# Patient Record
Sex: Female | Born: 1940 | Race: White | Hispanic: No | Marital: Married | Smoking: Former smoker
Health system: Southern US, Community
[De-identification: ages and names within clinical notes are randomized; demographics above are authoritative.]

## PROBLEM LIST (undated history)

## (undated) DIAGNOSIS — I10 Essential (primary) hypertension: Secondary | ICD-10-CM

## (undated) DIAGNOSIS — M1990 Unspecified osteoarthritis, unspecified site: Secondary | ICD-10-CM

## (undated) HISTORY — PX: COLONOSCOPY W/ POLYPECTOMY: SHX1380

## (undated) HISTORY — PX: OTHER SURGICAL HISTORY: SHX169

---

## 1998-04-20 ENCOUNTER — Other Ambulatory Visit: Admission: RE | Admit: 1998-04-20 | Discharge: 1998-04-20 | Payer: Self-pay | Admitting: Internal Medicine

## 1999-04-24 ENCOUNTER — Ambulatory Visit (HOSPITAL_COMMUNITY): Admission: RE | Admit: 1999-04-24 | Discharge: 1999-04-24 | Payer: Self-pay | Admitting: Internal Medicine

## 1999-04-24 ENCOUNTER — Encounter (HOSPITAL_BASED_OUTPATIENT_CLINIC_OR_DEPARTMENT_OTHER): Payer: Self-pay | Admitting: Internal Medicine

## 2000-06-10 ENCOUNTER — Other Ambulatory Visit: Admission: RE | Admit: 2000-06-10 | Discharge: 2000-06-10 | Payer: Self-pay | Admitting: Internal Medicine

## 2004-11-02 ENCOUNTER — Ambulatory Visit (HOSPITAL_COMMUNITY): Admission: RE | Admit: 2004-11-02 | Discharge: 2004-11-02 | Payer: Self-pay | Admitting: Gastroenterology

## 2013-12-22 ENCOUNTER — Inpatient Hospital Stay: Admit: 2013-12-22 | Payer: Self-pay | Admitting: Orthopedic Surgery

## 2013-12-22 SURGERY — ARTHROPLASTY, KNEE, TOTAL
Anesthesia: General | Laterality: Right

## 2015-01-12 DIAGNOSIS — E039 Hypothyroidism, unspecified: Secondary | ICD-10-CM | POA: Diagnosis not present

## 2015-01-12 DIAGNOSIS — R8299 Other abnormal findings in urine: Secondary | ICD-10-CM | POA: Diagnosis not present

## 2015-01-12 DIAGNOSIS — N390 Urinary tract infection, site not specified: Secondary | ICD-10-CM | POA: Diagnosis not present

## 2015-01-12 DIAGNOSIS — E559 Vitamin D deficiency, unspecified: Secondary | ICD-10-CM | POA: Diagnosis not present

## 2015-01-12 DIAGNOSIS — E785 Hyperlipidemia, unspecified: Secondary | ICD-10-CM | POA: Diagnosis not present

## 2015-01-12 DIAGNOSIS — N183 Chronic kidney disease, stage 3 (moderate): Secondary | ICD-10-CM | POA: Diagnosis not present

## 2015-01-12 DIAGNOSIS — E1129 Type 2 diabetes mellitus with other diabetic kidney complication: Secondary | ICD-10-CM | POA: Diagnosis not present

## 2015-01-19 DIAGNOSIS — M5432 Sciatica, left side: Secondary | ICD-10-CM | POA: Diagnosis not present

## 2015-01-19 DIAGNOSIS — Z0000 Encounter for general adult medical examination without abnormal findings: Secondary | ICD-10-CM | POA: Diagnosis not present

## 2015-01-19 DIAGNOSIS — K219 Gastro-esophageal reflux disease without esophagitis: Secondary | ICD-10-CM | POA: Diagnosis not present

## 2015-01-19 DIAGNOSIS — N183 Chronic kidney disease, stage 3 (moderate): Secondary | ICD-10-CM | POA: Diagnosis not present

## 2015-01-19 DIAGNOSIS — K5730 Diverticulosis of large intestine without perforation or abscess without bleeding: Secondary | ICD-10-CM | POA: Diagnosis not present

## 2015-01-19 DIAGNOSIS — D649 Anemia, unspecified: Secondary | ICD-10-CM | POA: Diagnosis not present

## 2015-01-19 DIAGNOSIS — E1129 Type 2 diabetes mellitus with other diabetic kidney complication: Secondary | ICD-10-CM | POA: Diagnosis not present

## 2015-01-19 DIAGNOSIS — I440 Atrioventricular block, first degree: Secondary | ICD-10-CM | POA: Diagnosis not present

## 2015-01-20 DIAGNOSIS — Z1212 Encounter for screening for malignant neoplasm of rectum: Secondary | ICD-10-CM | POA: Diagnosis not present

## 2015-02-15 ENCOUNTER — Other Ambulatory Visit: Payer: Self-pay | Admitting: Gastroenterology

## 2015-02-16 ENCOUNTER — Encounter (HOSPITAL_COMMUNITY): Payer: Self-pay | Admitting: *Deleted

## 2015-02-18 DIAGNOSIS — L570 Actinic keratosis: Secondary | ICD-10-CM | POA: Diagnosis not present

## 2015-02-18 DIAGNOSIS — Z85828 Personal history of other malignant neoplasm of skin: Secondary | ICD-10-CM | POA: Diagnosis not present

## 2015-02-18 DIAGNOSIS — D485 Neoplasm of uncertain behavior of skin: Secondary | ICD-10-CM | POA: Diagnosis not present

## 2015-02-24 ENCOUNTER — Ambulatory Visit (HOSPITAL_COMMUNITY): Payer: Commercial Managed Care - HMO | Admitting: Anesthesiology

## 2015-02-24 ENCOUNTER — Ambulatory Visit (HOSPITAL_COMMUNITY)
Admission: RE | Admit: 2015-02-24 | Discharge: 2015-02-24 | Disposition: A | Discharge status: Home / Self Care | Payer: Commercial Managed Care - HMO | Source: Ambulatory Visit | Attending: Gastroenterology | Admitting: Gastroenterology

## 2015-02-24 ENCOUNTER — Encounter (HOSPITAL_COMMUNITY): Payer: Self-pay | Admitting: *Deleted

## 2015-02-24 ENCOUNTER — Encounter (HOSPITAL_COMMUNITY)
Admission: RE | Disposition: A | Discharge status: Home / Self Care | Payer: Self-pay | Source: Ambulatory Visit | Attending: Gastroenterology

## 2015-02-24 DIAGNOSIS — I10 Essential (primary) hypertension: Secondary | ICD-10-CM | POA: Diagnosis not present

## 2015-02-24 DIAGNOSIS — D123 Benign neoplasm of transverse colon: Secondary | ICD-10-CM | POA: Diagnosis not present

## 2015-02-24 DIAGNOSIS — Z1211 Encounter for screening for malignant neoplasm of colon: Secondary | ICD-10-CM | POA: Insufficient documentation

## 2015-02-24 DIAGNOSIS — R195 Other fecal abnormalities: Secondary | ICD-10-CM | POA: Diagnosis not present

## 2015-02-24 DIAGNOSIS — Z86010 Personal history of colonic polyps: Secondary | ICD-10-CM | POA: Insufficient documentation

## 2015-02-24 DIAGNOSIS — K922 Gastrointestinal hemorrhage, unspecified: Secondary | ICD-10-CM | POA: Diagnosis not present

## 2015-02-24 DIAGNOSIS — K5790 Diverticulosis of intestine, part unspecified, without perforation or abscess without bleeding: Secondary | ICD-10-CM | POA: Diagnosis not present

## 2015-02-24 DIAGNOSIS — Z87891 Personal history of nicotine dependence: Secondary | ICD-10-CM | POA: Diagnosis not present

## 2015-02-24 HISTORY — PX: COLONOSCOPY WITH PROPOFOL: SHX5780

## 2015-02-24 HISTORY — DX: Essential (primary) hypertension: I10

## 2015-02-24 HISTORY — PX: ESOPHAGOGASTRODUODENOSCOPY (EGD) WITH PROPOFOL: SHX5813

## 2015-02-24 HISTORY — DX: Unspecified osteoarthritis, unspecified site: M19.90

## 2015-02-24 SURGERY — ESOPHAGOGASTRODUODENOSCOPY (EGD) WITH PROPOFOL
Anesthesia: Monitor Anesthesia Care

## 2015-02-24 MED ORDER — SODIUM CHLORIDE 0.9 % IV SOLN
INTRAVENOUS | Status: DC
Start: 2015-02-24 — End: 2015-02-24

## 2015-02-24 MED ORDER — LACTATED RINGERS IV SOLN
INTRAVENOUS | Status: DC
  Administered 2015-02-24: 12:00:00 via INTRAVENOUS
  Administered 2015-02-24: 1000 mL via INTRAVENOUS

## 2015-02-24 MED ORDER — PROPOFOL 10 MG/ML IV BOLUS
INTRAVENOUS | Status: AC
  Filled 2015-02-24: qty 20

## 2015-02-24 MED ORDER — LIDOCAINE HCL (CARDIAC) 20 MG/ML IV SOLN
INTRAVENOUS | Status: AC
  Filled 2015-02-24: qty 5

## 2015-02-24 MED ORDER — PROPOFOL INFUSION 10 MG/ML OPTIME
INTRAVENOUS | Status: DC | PRN
  Administered 2015-02-24: 100 ug/kg/min via INTRAVENOUS

## 2015-02-24 MED ORDER — BUTAMBEN-TETRACAINE-BENZOCAINE 2-2-14 % EX AERO
INHALATION_SPRAY | CUTANEOUS | Status: DC | PRN
  Administered 2015-02-24: 1 via TOPICAL

## 2015-02-24 MED ORDER — PROPOFOL 500 MG/50ML IV EMUL
INTRAVENOUS | Status: DC | PRN
  Administered 2015-02-24: 30 mg via INTRAVENOUS
  Administered 2015-02-24: 50 mg via INTRAVENOUS
  Administered 2015-02-24: 30 mg via INTRAVENOUS

## 2015-02-24 SURGICAL SUPPLY — 25 items

## 2015-02-24 NOTE — Discharge Instructions (Signed)
Colonoscopy, Care After °Refer to this sheet in the next few weeks. These instructions provide you with information on caring for yourself after your procedure. Your health care provider may also give you more specific instructions. Your treatment has been planned according to current medical practices, but problems sometimes occur. Call your health care provider if you have any problems or questions after your procedure. °WHAT TO EXPECT AFTER THE PROCEDURE  °After your procedure, it is typical to have the following: °· A small amount of blood in your stool. °· Moderate amounts of gas and mild abdominal cramping or bloating. °HOME CARE INSTRUCTIONS °· Do not drive, operate machinery, or sign important documents for 24 hours. °· You may shower and resume your regular physical activities, but move at a slower pace for the first 24 hours. °· Take frequent rest periods for the first 24 hours. °· Walk around or put a warm pack on your abdomen to help reduce abdominal cramping and bloating. °· Drink enough fluids to keep your urine clear or pale yellow. °· You may resume your normal diet as instructed by your health care provider. Avoid heavy or fried foods that are hard to digest. °· Avoid drinking alcohol for 24 hours or as instructed by your health care provider. °· Only take over-the-counter or prescription medicines as directed by your health care provider. °· If a tissue sample (biopsy) was taken during your procedure: °· Do not take aspirin or blood thinners for 7 days, or as instructed by your health care provider. °· Do not drink alcohol for 7 days, or as instructed by your health care provider. °· Eat soft foods for the first 24 hours. °SEEK MEDICAL CARE IF: °You have persistent spotting of blood in your stool 2-3 days after the procedure. °SEEK IMMEDIATE MEDICAL CARE IF: °· You have more than a small spotting of blood in your stool. °· You pass large blood clots in your stool. °· Your abdomen is swollen  (distended). °· You have nausea or vomiting. °· You have a fever. °· You have increasing abdominal pain that is not relieved with medicine. °Document Released: 03/13/2004 Document Revised: 05/20/2013 Document Reviewed: 04/06/2013 °ExitCare® Patient Information ©2015 [employer]. This information is not intended to replace advice given to you by your health care provider. Make sure you discuss any questions you have with your health care provider. °Esophagogastroduodenoscopy °Care After °Refer to this sheet in the next few weeks. These instructions provide you with information on caring for yourself after your procedure. Your caregiver may also give you more specific instructions. Your treatment has been planned according to current medical practices, but problems sometimes occur. Call your caregiver if you have any problems or questions after your procedure.  °HOME CARE INSTRUCTIONS °· Do not eat or drink anything until the numbing medicine (local anesthetic) has worn off and your gag reflex has returned. You will know that the local anesthetic has worn off when you can swallow comfortably. °· Do not drive for 12 hours after the procedure or as directed by your caregiver. °· Only take medicines as directed by your caregiver. °SEEK MEDICAL CARE IF:  °· You cannot stop coughing. °· You are not urinating at all or less than usual. °SEEK IMMEDIATE MEDICAL CARE IF: °· You have difficulty swallowing. °· You cannot eat or drink. °· You have worsening throat or chest pain. °· You have dizziness, lightheadedness, or you faint. °· You have nausea or vomiting. °· You have chills. °· You have a   fever. °· You have severe abdominal pain. °· You have black, tarry, or bloody stools. °Document Released: 07/16/2012 Document Reviewed: 07/16/2012 °ExitCare® Patient Information ©2015 [employer]. This information is not intended to replace advice given to you by your health care provider. Make sure you discuss any questions you have  with your health care provider. ° ° °

## 2015-02-24 NOTE — Op Note (Signed)
Problem: Heme positive stool while taking aspirin. 12/06/2009 normal surveillance colonoscopy. 11/02/2004 colonoscopy performed with removal of a 3 mm adenomatous rectal polyp  Endoscopist: Earle Gell  Premedication: Propofol administered by anesthesia  Procedure: Diagnostic esophagogastroduodenoscopy The patient was placed in the left lateral decubitus position. The Pentax gastroscope was passed through the posterior hypopharynx into the proximal esophagus without difficulty. The hypopharynx, larynx, and vocal cords appeared normal.  Esophagoscopy: The proximal, mid, and lower segments of the esophageal mucosa appeared normal. The squamocolumnar junction was noted at approximately 35 cm from the incisor teeth.  Gastroscopy: Retroflex view of the gastric cardia and fundus was normal. The gastric body, antrum, and pylorus appeared normal.  Duodenoscopy: Duodenal bulb and descending duodenum appeared normal.  Assessment: Normal diagnostic esophagogastroduodenoscopy  Procedure: Surveillance colonoscopy Anal inspection and digital rectal exam were normal. The Pentax pediatric colonoscope was introduced into the rectum and advanced to the cecum. A normal-appearing appendiceal orifice and ileocecal valve were identified. Colonic preparation for the exam today was good. Withdrawal time was 13 minutes  Rectum. Normal. Retroflexed view of the distal rectum was normal  Sigmoid colon and descending colon. Left colonic diverticulosis  Splenic flexure. Normal  Transverse colon. A 3 mm sessile polyp was removed from the mid transverse colon with the cold biopsy forceps  Hepatic flexure. Normal.  Ascending colon. Normal  Cecum and ileocecal valve. Normal  Assessment:  #1. Left colonic diverticulosis  #2. A diminutive polyp was removed from the mid transverse colon with cold biopsy forceps

## 2015-02-24 NOTE — Anesthesia Postprocedure Evaluation (Signed)
  Anesthesia Post-op Note  Patient: Gabrielle Glover  Procedure(s) Performed: Procedure(s) (LRB): ESOPHAGOGASTRODUODENOSCOPY (EGD) WITH PROPOFOL (N/A) COLONOSCOPY WITH PROPOFOL (N/A)  Patient Location: PACU  Anesthesia Type: MAC  Level of Consciousness: awake and alert   Airway and Oxygen Therapy: Patient Spontanous Breathing  Post-op Pain: mild  Post-op Assessment: Post-op Vital signs reviewed, Patient's Cardiovascular Status Stable, Respiratory Function Stable, Patent Airway and No signs of Nausea or vomiting  Last Vitals:  Filed Vitals:   02/24/15 1358  BP: 156/81  Pulse: 84  Temp:   Resp: 17    Post-op Vital Signs: stable   Complications: No apparent anesthesia complications

## 2015-02-24 NOTE — H&P (Signed)
  Problem: Heme positive stool on aspirin. 12/06/2009 normal surveillance colonoscopy. 11/02/2004 colonoscopy performed with removal of a diminutive adenomatous rectal polyp  History: The patient is a 74 year old female born 1941-01-22. She has been feeling quite well. She submitted stool for Hemoccults testing as part of her health maintenance physical exam. Her stool was heme positive. She has passed black tarry stool on a few occasions over the past few months. She reports no abdominal pain. She has stopped taking aspirin.  The patient is scheduled to undergo surveillance colonoscopy and diagnostic esophagogastroduodenoscopy.  Medication allergies: None  Past medical history: Hypertension. Diminutive adenomatous rectal polyp removed in 2006.  Exam: Patient is alert and lying comfortably on the endoscopy stretcher. Abdomen is soft and nontender to palpation. Lungs are clear to auscultation. Cardiac exam reveals a regular rhythm.  Plan: Proceed with diagnostic esophagogastroduodenoscopy and surveillance colonoscopy.

## 2015-02-24 NOTE — Transfer of Care (Signed)
Immediate Anesthesia Transfer of Care Note  Patient: Gabrielle Glover  Procedure(s) Performed: Procedure(s): ESOPHAGOGASTRODUODENOSCOPY (EGD) WITH PROPOFOL (N/A) COLONOSCOPY WITH PROPOFOL (N/A)  Patient Location: PACU  Anesthesia Type:MAC  Level of Consciousness: awake, alert  and oriented  Airway & Oxygen Therapy: Patient Spontanous Breathing and Patient connected to nasal cannula oxygen  Post-op Assessment: Report given to RN and Post -op Vital signs reviewed and stable  Post vital signs: Reviewed and stable  Last Vitals:  Filed Vitals:   02/24/15 1140  BP: 194/70  Temp: 36.7 C  Resp: 23    Complications: No apparent anesthesia complications

## 2015-02-24 NOTE — Anesthesia Preprocedure Evaluation (Addendum)
Anesthesia Evaluation  Patient identified by MRN, date of birth, ID band Patient awake    Reviewed: Allergy & Precautions, NPO status , Patient's Chart, lab work & pertinent test results  Airway Mallampati: II  TM Distance: >3 FB Neck ROM: Full    Dental no notable dental hx.    Pulmonary neg pulmonary ROS, former smoker,  breath sounds clear to auscultation  Pulmonary exam normal       Cardiovascular hypertension, Pt. on medications Normal cardiovascular examRhythm:Regular Rate:Normal     Neuro/Psych negative neurological ROS  negative psych ROS   GI/Hepatic negative GI ROS, Neg liver ROS,   Endo/Other  negative endocrine ROS  Renal/GU negative Renal ROS  negative genitourinary   Musculoskeletal negative musculoskeletal ROS (+)   Abdominal   Peds negative pediatric ROS (+)  Hematology negative hematology ROS (+)   Anesthesia Other Findings   Reproductive/Obstetrics negative OB ROS                           Anesthesia Physical Anesthesia Plan  ASA: II  Anesthesia Plan: MAC   Post-op Pain Management:    Induction: Intravenous  Airway Management Planned: Nasal Cannula  Additional Equipment:   Intra-op Plan:   Post-operative Plan:   Informed Consent: I have reviewed the patients History and Physical, chart, labs and discussed the procedure including the risks, benefits and alternatives for the proposed anesthesia with the patient or authorized representative who has indicated his/her understanding and acceptance.   Dental advisory given  Plan Discussed with: CRNA and Surgeon  Anesthesia Plan Comments:         Anesthesia Quick Evaluation

## 2015-02-28 ENCOUNTER — Encounter (HOSPITAL_COMMUNITY): Payer: Self-pay | Admitting: Gastroenterology

## 2015-07-18 DIAGNOSIS — Z85828 Personal history of other malignant neoplasm of skin: Secondary | ICD-10-CM | POA: Diagnosis not present

## 2015-07-18 DIAGNOSIS — D485 Neoplasm of uncertain behavior of skin: Secondary | ICD-10-CM | POA: Diagnosis not present

## 2015-07-18 DIAGNOSIS — D0471 Carcinoma in situ of skin of right lower limb, including hip: Secondary | ICD-10-CM | POA: Diagnosis not present

## 2016-01-19 DIAGNOSIS — L820 Inflamed seborrheic keratosis: Secondary | ICD-10-CM | POA: Diagnosis not present

## 2016-01-19 DIAGNOSIS — D485 Neoplasm of uncertain behavior of skin: Secondary | ICD-10-CM | POA: Diagnosis not present

## 2016-01-19 DIAGNOSIS — C44622 Squamous cell carcinoma of skin of right upper limb, including shoulder: Secondary | ICD-10-CM | POA: Diagnosis not present

## 2016-01-19 DIAGNOSIS — L570 Actinic keratosis: Secondary | ICD-10-CM | POA: Diagnosis not present

## 2016-01-19 DIAGNOSIS — D0461 Carcinoma in situ of skin of right upper limb, including shoulder: Secondary | ICD-10-CM | POA: Diagnosis not present

## 2016-01-19 DIAGNOSIS — Z85828 Personal history of other malignant neoplasm of skin: Secondary | ICD-10-CM | POA: Diagnosis not present

## 2016-01-25 DIAGNOSIS — E1129 Type 2 diabetes mellitus with other diabetic kidney complication: Secondary | ICD-10-CM | POA: Diagnosis not present

## 2016-01-25 DIAGNOSIS — Z0100 Encounter for examination of eyes and vision without abnormal findings: Secondary | ICD-10-CM | POA: Diagnosis not present

## 2016-01-25 DIAGNOSIS — E559 Vitamin D deficiency, unspecified: Secondary | ICD-10-CM | POA: Diagnosis not present

## 2016-01-25 DIAGNOSIS — H04123 Dry eye syndrome of bilateral lacrimal glands: Secondary | ICD-10-CM | POA: Diagnosis not present

## 2016-01-25 DIAGNOSIS — H43813 Vitreous degeneration, bilateral: Secondary | ICD-10-CM | POA: Diagnosis not present

## 2016-01-25 DIAGNOSIS — I10 Essential (primary) hypertension: Secondary | ICD-10-CM | POA: Diagnosis not present

## 2016-01-25 DIAGNOSIS — E784 Other hyperlipidemia: Secondary | ICD-10-CM | POA: Diagnosis not present

## 2016-01-25 DIAGNOSIS — H26493 Other secondary cataract, bilateral: Secondary | ICD-10-CM | POA: Diagnosis not present

## 2016-02-02 DIAGNOSIS — Z0000 Encounter for general adult medical examination without abnormal findings: Secondary | ICD-10-CM | POA: Diagnosis not present

## 2016-02-02 DIAGNOSIS — E784 Other hyperlipidemia: Secondary | ICD-10-CM | POA: Diagnosis not present

## 2016-02-02 DIAGNOSIS — D6489 Other specified anemias: Secondary | ICD-10-CM | POA: Diagnosis not present

## 2016-02-02 DIAGNOSIS — E038 Other specified hypothyroidism: Secondary | ICD-10-CM | POA: Diagnosis not present

## 2016-02-02 DIAGNOSIS — N183 Chronic kidney disease, stage 3 (moderate): Secondary | ICD-10-CM | POA: Diagnosis not present

## 2016-02-02 DIAGNOSIS — G4709 Other insomnia: Secondary | ICD-10-CM | POA: Diagnosis not present

## 2016-02-02 DIAGNOSIS — E1129 Type 2 diabetes mellitus with other diabetic kidney complication: Secondary | ICD-10-CM | POA: Diagnosis not present

## 2016-02-02 DIAGNOSIS — I440 Atrioventricular block, first degree: Secondary | ICD-10-CM | POA: Diagnosis not present

## 2016-02-02 DIAGNOSIS — I10 Essential (primary) hypertension: Secondary | ICD-10-CM | POA: Diagnosis not present

## 2016-04-28 DIAGNOSIS — Z23 Encounter for immunization: Secondary | ICD-10-CM | POA: Diagnosis not present

## 2016-06-27 DIAGNOSIS — Z1231 Encounter for screening mammogram for malignant neoplasm of breast: Secondary | ICD-10-CM | POA: Diagnosis not present

## 2016-07-26 DIAGNOSIS — I10 Essential (primary) hypertension: Secondary | ICD-10-CM | POA: Diagnosis not present

## 2016-07-26 DIAGNOSIS — M109 Gout, unspecified: Secondary | ICD-10-CM | POA: Diagnosis not present

## 2016-07-26 DIAGNOSIS — Z6827 Body mass index (BMI) 27.0-27.9, adult: Secondary | ICD-10-CM | POA: Diagnosis not present

## 2016-07-26 DIAGNOSIS — J3089 Other allergic rhinitis: Secondary | ICD-10-CM | POA: Diagnosis not present

## 2016-07-26 DIAGNOSIS — E1129 Type 2 diabetes mellitus with other diabetic kidney complication: Secondary | ICD-10-CM | POA: Diagnosis not present

## 2016-07-26 DIAGNOSIS — N183 Chronic kidney disease, stage 3 (moderate): Secondary | ICD-10-CM | POA: Diagnosis not present

## 2016-08-23 DIAGNOSIS — N390 Urinary tract infection, site not specified: Secondary | ICD-10-CM | POA: Diagnosis not present

## 2016-08-23 DIAGNOSIS — R8299 Other abnormal findings in urine: Secondary | ICD-10-CM | POA: Diagnosis not present

## 2016-08-31 DIAGNOSIS — R300 Dysuria: Secondary | ICD-10-CM | POA: Diagnosis not present

## 2016-08-31 DIAGNOSIS — N390 Urinary tract infection, site not specified: Secondary | ICD-10-CM | POA: Diagnosis not present

## 2016-08-31 DIAGNOSIS — M109 Gout, unspecified: Secondary | ICD-10-CM | POA: Diagnosis not present

## 2016-12-11 DIAGNOSIS — E1129 Type 2 diabetes mellitus with other diabetic kidney complication: Secondary | ICD-10-CM | POA: Diagnosis not present

## 2017-01-25 DIAGNOSIS — H43813 Vitreous degeneration, bilateral: Secondary | ICD-10-CM | POA: Diagnosis not present

## 2017-01-25 DIAGNOSIS — E119 Type 2 diabetes mellitus without complications: Secondary | ICD-10-CM | POA: Diagnosis not present

## 2017-01-25 DIAGNOSIS — Z961 Presence of intraocular lens: Secondary | ICD-10-CM | POA: Diagnosis not present

## 2017-01-25 DIAGNOSIS — H04123 Dry eye syndrome of bilateral lacrimal glands: Secondary | ICD-10-CM | POA: Diagnosis not present

## 2017-03-11 DIAGNOSIS — E559 Vitamin D deficiency, unspecified: Secondary | ICD-10-CM | POA: Diagnosis not present

## 2017-03-11 DIAGNOSIS — I10 Essential (primary) hypertension: Secondary | ICD-10-CM | POA: Diagnosis not present

## 2017-03-11 DIAGNOSIS — E038 Other specified hypothyroidism: Secondary | ICD-10-CM | POA: Diagnosis not present

## 2017-03-11 DIAGNOSIS — E1129 Type 2 diabetes mellitus with other diabetic kidney complication: Secondary | ICD-10-CM | POA: Diagnosis not present

## 2017-03-11 DIAGNOSIS — E784 Other hyperlipidemia: Secondary | ICD-10-CM | POA: Diagnosis not present

## 2017-03-11 DIAGNOSIS — M109 Gout, unspecified: Secondary | ICD-10-CM | POA: Diagnosis not present

## 2017-03-20 DIAGNOSIS — N183 Chronic kidney disease, stage 3 (moderate): Secondary | ICD-10-CM | POA: Diagnosis not present

## 2017-03-20 DIAGNOSIS — I440 Atrioventricular block, first degree: Secondary | ICD-10-CM | POA: Diagnosis not present

## 2017-03-20 DIAGNOSIS — Z0000 Encounter for general adult medical examination without abnormal findings: Secondary | ICD-10-CM | POA: Diagnosis not present

## 2017-03-20 DIAGNOSIS — N3281 Overactive bladder: Secondary | ICD-10-CM | POA: Diagnosis not present

## 2017-03-20 DIAGNOSIS — M25539 Pain in unspecified wrist: Secondary | ICD-10-CM | POA: Diagnosis not present

## 2017-03-20 DIAGNOSIS — M109 Gout, unspecified: Secondary | ICD-10-CM | POA: Diagnosis not present

## 2017-03-20 DIAGNOSIS — D6489 Other specified anemias: Secondary | ICD-10-CM | POA: Diagnosis not present

## 2017-03-20 DIAGNOSIS — E1129 Type 2 diabetes mellitus with other diabetic kidney complication: Secondary | ICD-10-CM | POA: Diagnosis not present

## 2017-03-20 DIAGNOSIS — E871 Hypo-osmolality and hyponatremia: Secondary | ICD-10-CM | POA: Diagnosis not present

## 2017-05-08 DIAGNOSIS — E871 Hypo-osmolality and hyponatremia: Secondary | ICD-10-CM | POA: Diagnosis not present

## 2017-05-08 DIAGNOSIS — M109 Gout, unspecified: Secondary | ICD-10-CM | POA: Diagnosis not present

## 2017-05-11 DIAGNOSIS — Z23 Encounter for immunization: Secondary | ICD-10-CM | POA: Diagnosis not present

## 2017-09-16 DIAGNOSIS — E1129 Type 2 diabetes mellitus with other diabetic kidney complication: Secondary | ICD-10-CM | POA: Diagnosis not present

## 2017-09-16 DIAGNOSIS — I10 Essential (primary) hypertension: Secondary | ICD-10-CM | POA: Diagnosis not present

## 2017-09-16 DIAGNOSIS — Z6827 Body mass index (BMI) 27.0-27.9, adult: Secondary | ICD-10-CM | POA: Diagnosis not present

## 2017-09-16 DIAGNOSIS — E038 Other specified hypothyroidism: Secondary | ICD-10-CM | POA: Diagnosis not present

## 2017-09-16 DIAGNOSIS — Z1389 Encounter for screening for other disorder: Secondary | ICD-10-CM | POA: Diagnosis not present

## 2017-09-16 DIAGNOSIS — M109 Gout, unspecified: Secondary | ICD-10-CM | POA: Diagnosis not present

## 2018-01-15 DIAGNOSIS — M7751 Other enthesopathy of right foot: Secondary | ICD-10-CM | POA: Diagnosis not present

## 2018-01-15 DIAGNOSIS — Z6828 Body mass index (BMI) 28.0-28.9, adult: Secondary | ICD-10-CM | POA: Diagnosis not present

## 2018-02-07 DIAGNOSIS — H43812 Vitreous degeneration, left eye: Secondary | ICD-10-CM | POA: Diagnosis not present

## 2018-02-07 DIAGNOSIS — E119 Type 2 diabetes mellitus without complications: Secondary | ICD-10-CM | POA: Diagnosis not present

## 2018-02-07 DIAGNOSIS — H31002 Unspecified chorioretinal scars, left eye: Secondary | ICD-10-CM | POA: Diagnosis not present

## 2018-02-07 DIAGNOSIS — H524 Presbyopia: Secondary | ICD-10-CM | POA: Diagnosis not present

## 2018-03-19 DIAGNOSIS — T148XXS Other injury of unspecified body region, sequela: Secondary | ICD-10-CM | POA: Diagnosis not present

## 2018-03-19 DIAGNOSIS — R600 Localized edema: Secondary | ICD-10-CM | POA: Diagnosis not present

## 2018-03-19 DIAGNOSIS — Z6828 Body mass index (BMI) 28.0-28.9, adult: Secondary | ICD-10-CM | POA: Diagnosis not present

## 2018-03-19 DIAGNOSIS — S8011XA Contusion of right lower leg, initial encounter: Secondary | ICD-10-CM | POA: Diagnosis not present

## 2018-03-19 DIAGNOSIS — M7989 Other specified soft tissue disorders: Secondary | ICD-10-CM | POA: Diagnosis not present

## 2018-04-19 DIAGNOSIS — Z23 Encounter for immunization: Secondary | ICD-10-CM | POA: Diagnosis not present

## 2018-04-25 DIAGNOSIS — E1129 Type 2 diabetes mellitus with other diabetic kidney complication: Secondary | ICD-10-CM | POA: Diagnosis not present

## 2018-04-25 DIAGNOSIS — E559 Vitamin D deficiency, unspecified: Secondary | ICD-10-CM | POA: Diagnosis not present

## 2018-04-25 DIAGNOSIS — N183 Chronic kidney disease, stage 3 (moderate): Secondary | ICD-10-CM | POA: Diagnosis not present

## 2018-04-25 DIAGNOSIS — E7849 Other hyperlipidemia: Secondary | ICD-10-CM | POA: Diagnosis not present

## 2018-04-25 DIAGNOSIS — E038 Other specified hypothyroidism: Secondary | ICD-10-CM | POA: Diagnosis not present

## 2018-04-25 DIAGNOSIS — M109 Gout, unspecified: Secondary | ICD-10-CM | POA: Diagnosis not present

## 2018-04-25 DIAGNOSIS — R82998 Other abnormal findings in urine: Secondary | ICD-10-CM | POA: Diagnosis not present

## 2018-05-02 DIAGNOSIS — M7750 Other enthesopathy of unspecified foot: Secondary | ICD-10-CM | POA: Diagnosis not present

## 2018-05-02 DIAGNOSIS — R600 Localized edema: Secondary | ICD-10-CM | POA: Diagnosis not present

## 2018-05-02 DIAGNOSIS — E1129 Type 2 diabetes mellitus with other diabetic kidney complication: Secondary | ICD-10-CM | POA: Diagnosis not present

## 2018-05-02 DIAGNOSIS — N183 Chronic kidney disease, stage 3 (moderate): Secondary | ICD-10-CM | POA: Diagnosis not present

## 2018-05-02 DIAGNOSIS — E7849 Other hyperlipidemia: Secondary | ICD-10-CM | POA: Diagnosis not present

## 2018-05-02 DIAGNOSIS — M109 Gout, unspecified: Secondary | ICD-10-CM | POA: Diagnosis not present

## 2018-05-02 DIAGNOSIS — Z0000 Encounter for general adult medical examination without abnormal findings: Secondary | ICD-10-CM | POA: Diagnosis not present

## 2018-05-02 DIAGNOSIS — E038 Other specified hypothyroidism: Secondary | ICD-10-CM | POA: Diagnosis not present

## 2018-05-02 DIAGNOSIS — D6489 Other specified anemias: Secondary | ICD-10-CM | POA: Diagnosis not present

## 2018-07-02 DIAGNOSIS — E038 Other specified hypothyroidism: Secondary | ICD-10-CM | POA: Diagnosis not present

## 2020-01-22 ENCOUNTER — Ambulatory Visit: Payer: Medicare Other | Admitting: Orthopaedic Surgery

## 2020-01-22 ENCOUNTER — Ambulatory Visit: Payer: Self-pay

## 2020-01-22 ENCOUNTER — Encounter: Payer: Self-pay | Admitting: Orthopaedic Surgery

## 2020-01-22 DIAGNOSIS — M25562 Pain in left knee: Secondary | ICD-10-CM

## 2020-01-22 DIAGNOSIS — G8929 Other chronic pain: Secondary | ICD-10-CM | POA: Diagnosis not present

## 2020-01-22 MED ORDER — LIDOCAINE HCL 1 % IJ SOLN
2.0000 mL | INTRAMUSCULAR | Status: AC | PRN
  Administered 2020-01-22: 2 mL

## 2020-01-22 MED ORDER — BUPIVACAINE HCL 0.5 % IJ SOLN
2.0000 mL | INTRAMUSCULAR | Status: AC | PRN
  Administered 2020-01-22: 2 mL via INTRA_ARTICULAR

## 2020-01-22 MED ORDER — METHYLPREDNISOLONE ACETATE 40 MG/ML IJ SUSP
40.0000 mg | INTRAMUSCULAR | Status: AC | PRN
  Administered 2020-01-22: 40 mg via INTRA_ARTICULAR

## 2020-01-22 NOTE — Progress Notes (Signed)
Office Visit Note   Patient: Gabrielle Glover           Date of Birth: 1941-07-14           MRN: [id number] Visit Date: 01/22/2020              Requested by: Crist Infante, MD [address] [place],   [address] PCP: Crist Infante, MD   Assessment & Plan: Visit Diagnoses:  1. Chronic pain of left knee     Plan: Impression is acute left medial meniscal tear without mechanical symptoms.  We performed an aspiration and injection in the office today.  We obtained approximately 30 cc of blood-tinged fluid.  Cortisone was injected as well.  Compression wrap applied.  Patient will take it easy over the next couple weeks and then increase activity as tolerated.  Hopefully we can avoid any surgical intervention in the future with this issue.  Follow-Up Instructions: Return if symptoms worsen or fail to improve.   Orders:  Orders Placed This Encounter  Procedures  . XR KNEE 3 VIEW LEFT   No orders of the defined types were placed in this encounter.     Procedures: Large Joint Inj: L knee on 01/22/2020 11:42 AM Details: 22 G needle Medications: 2 mL bupivacaine 0.5 %; 2 mL lidocaine 1 %; 40 mg methylPREDNISolone acetate 40 MG/ML Outcome: tolerated well, no immediate complications Patient was prepped and draped in the usual sterile fashion.       Clinical Data: No additional findings.   Subjective: Chief Complaint  Patient presents with  . Left Knee - Pain    Gabrielle Glover is a very pleasant 79 year old female comes in for evaluation of left knee pain that started 2 weeks ago when she was walking on the grass and felt pain in the back of her left knee.  It got slightly better but yesterday when she was turning she felt a pop in her knee and and it caused the knee to give way.  She is now ambulating with a cane.  She has not been able to walk her usual 2 miles a day.   Review of Systems  Constitutional: Negative.   HENT: Negative.   Eyes: Negative.   Respiratory:  Negative.   Cardiovascular: Negative.   Endocrine: Negative.   Musculoskeletal: Negative.   Neurological: Negative.   Hematological: Negative.   Psychiatric/Behavioral: Negative.   All other systems reviewed and are negative.    Objective: Vital Signs: There were no vitals taken for this visit.  Physical Exam Vitals and nursing note reviewed.  Constitutional:      Appearance: She is well-developed.  HENT:     Head: Normocephalic and atraumatic.  Pulmonary:     Effort: Pulmonary effort is normal.  Abdominal:     Palpations: Abdomen is soft.  Musculoskeletal:     Cervical back: Neck supple.  Skin:    General: Skin is warm.     Capillary Refill: Capillary refill takes less than 2 seconds.  Neurological:     Mental Status: She is alert and oriented to person, place, and time.  Psychiatric:        Behavior: Behavior normal.        Thought Content: Thought content normal.        Judgment: Judgment normal.     Ortho Exam Left knee shows a medium joint effusion.  She has medial joint line tenderness.  Negative McMurray.  Collaterals and cruciates are stable.  Mild limitation range  of motion secondary to the effusion. Specialty Comments:  No specialty comments available.  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  Past Medical History:  Diagnosis Date  . Arthritis    wrist  . Hypertension     History reviewed. No pertinent family history.  Past Surgical History:  Procedure Laterality Date  . cataracts Bilateral    with lens  . COLONOSCOPY W/ POLYPECTOMY      x2 -'06x1 polyp  . COLONOSCOPY WITH PROPOFOL N/A 02/24/2015   Procedure: COLONOSCOPY WITH PROPOFOL;  Surgeon: Garlan Fair, MD;  Location: [facility];  Service: Endoscopy;  Laterality: N/A;  . ESOPHAGOGASTRODUODENOSCOPY (EGD) WITH PROPOFOL N/A 02/24/2015   Procedure: ESOPHAGOGASTRODUODENOSCOPY (EGD) WITH  PROPOFOL;  Surgeon: Garlan Fair, MD;  Location: [facility];  Service: Endoscopy;  Laterality: N/A;   Social History   Occupational History  . Not on file  Tobacco Use  . Smoking status: Former Smoker    Years: 10.00    Quit date: 02/15/1982    Years since quitting: 37.9  . Tobacco comment: off and on smother, Quit 30 yrs ago.  Substance and Sexual Activity  . Alcohol use: Yes    Comment: wine daily   . Drug use: No  . Sexual activity: Not on file

## 2020-09-13 ENCOUNTER — Ambulatory Visit: Payer: Medicare Other | Admitting: Allergy and Immunology

## 2020-10-24 ENCOUNTER — Other Ambulatory Visit: Payer: Self-pay

## 2020-10-24 ENCOUNTER — Ambulatory Visit: Payer: Medicare Other | Admitting: Orthopaedic Surgery

## 2020-10-24 DIAGNOSIS — G5701 Lesion of sciatic nerve, right lower limb: Secondary | ICD-10-CM | POA: Diagnosis not present

## 2020-10-24 MED ORDER — PREDNISONE 5 MG (21) PO TBPK: ORAL_TABLET | ORAL | 0 refills | Status: AC

## 2020-10-24 NOTE — Progress Notes (Signed)
   Office Visit Note   Patient: Gabrielle Glover           Date of Birth: 03/03/1941           MRN: [id number] Visit Date: 10/24/2020              Requested by: Crist Infante, MD [address] [place],  [place] [address] PCP: Crist Infante, MD   Assessment & Plan: Visit Diagnoses:  1. Piriformis syndrome of right side     Plan: Impression is right-sided piriformis syndrome.  We have discussed treatment options to include prednisone pack and physical therapy for which she would like to proceed.  She will follow up with [place] as needed.  Follow-Up Instructions: Return if symptoms worsen or fail to improve.   Orders:  No orders of the defined types were placed in this encounter.  No orders of the defined types were placed in this encounter.     Procedures: No procedures performed   Clinical Data: No additional findings.   Subjective: Chief Complaint  Patient presents with  . RIGHT BUTTOCK PAIN    HPI patient is a very pleasant 80 year old female who comes in today with right buttocks pain.  This began after sitting on a hard wooden table for period of time on Christmas eve.  Her symptoms have been intermittent since and are primarily worse with ambulation.  The pain is solely located to the buttocks without any radiation down the right leg.  No paresthesias.  Massaging the area seems to slightly help.  She has tried Tylenol and Aspercreme without relief.  Review of Systems as detailed in HPI.  All others reviewed and are negative.   Objective: Vital Signs: There were no vitals taken for this visit.  Physical Exam well-developed well-nourished female no acute distress.  Alert and oriented x3.  Ortho Exam examination reveals moderate tenderness to the piriformis into the hamstring attachment.  Slight increased pain with straight leg raise.  No pain or weakness with resisted knee flexion.  She is neurovascular intact distally.  Specialty Comments:  No specialty comments  available.  Imaging: No new imaging   PMFS History: There are no problems to display for this patient.  Past Medical History:  Diagnosis Date  . Arthritis    wrist  . Hypertension     No family history on file.  Past Surgical History:  Procedure Laterality Date  . cataracts Bilateral    with lens  . COLONOSCOPY W/ POLYPECTOMY      x2 -'06x1 polyp  . COLONOSCOPY WITH PROPOFOL N/A 02/24/2015   Procedure: COLONOSCOPY WITH PROPOFOL;  Surgeon: Garlan Fair, MD;  Location: [facility];  Service: Endoscopy;  Laterality: N/A;  . ESOPHAGOGASTRODUODENOSCOPY (EGD) WITH PROPOFOL N/A 02/24/2015   Procedure: ESOPHAGOGASTRODUODENOSCOPY (EGD) WITH PROPOFOL;  Surgeon: Garlan Fair, MD;  Location: [facility];  Service: Endoscopy;  Laterality: N/A;   Social History   Occupational History  . Not on file  Tobacco Use  . Smoking status: Former Smoker    Years: 10.00    Quit date: 02/15/1982    Years since quitting: 38.7  . Smokeless tobacco: Not on file  . Tobacco comment: off and on smother, Quit 30 yrs ago.  Substance and Sexual Activity  . Alcohol use: Yes    Comment: wine daily   . Drug use: No  . Sexual activity: Not on file

## 2020-11-01 ENCOUNTER — Ambulatory Visit: Payer: Medicare Other | Admitting: Allergy and Immunology

## 2020-11-03 ENCOUNTER — Ambulatory Visit (INDEPENDENT_AMBULATORY_CARE_PROVIDER_SITE_OTHER): Payer: Medicare Other | Admitting: Rehabilitative and Restorative Service Providers"

## 2020-11-03 ENCOUNTER — Encounter: Payer: Self-pay | Admitting: Rehabilitative and Restorative Service Providers"

## 2020-11-03 ENCOUNTER — Other Ambulatory Visit: Payer: Self-pay

## 2020-11-03 DIAGNOSIS — M25652 Stiffness of left hip, not elsewhere classified: Secondary | ICD-10-CM | POA: Diagnosis not present

## 2020-11-03 DIAGNOSIS — R262 Difficulty in walking, not elsewhere classified: Secondary | ICD-10-CM

## 2020-11-03 DIAGNOSIS — M6281 Muscle weakness (generalized): Secondary | ICD-10-CM

## 2020-11-03 DIAGNOSIS — M25552 Pain in left hip: Secondary | ICD-10-CM

## 2020-11-03 NOTE — Patient Instructions (Signed)
Access Code: [email or web address] URL: [email or web address] Date: 11/03/2020 Prepared by: Vista Mink  Exercises Supine Figure 4 Piriformis Stretch - 2-3 x daily - 7 x weekly - 1 sets - 5 reps - 20 seconds hold Supine Gluteus Stretch - 2-3 x daily - 7 x weekly - 1 sets - 5 reps - 20 seconds hold Prone Hip Extension - 2 x daily - 7 x weekly - 2 sets - 10 reps - 3 seconds hold Bridge - 2 x daily - 7 x weekly - 2 sets - 10 reps - 5 seconds hold

## 2020-11-03 NOTE — Therapy (Signed)
[facility] [address] [place], [place], [address] Phone: [phone]   Fax:  [phone]  Physical Therapy Evaluation  Patient Details  Name: Gabrielle [facility] MRN: [id number] Date of Birth: 26-Aug-1940 Referring Provider (PT): Briscoe Deutscher   Encounter Date: 11/03/2020   PT End of Session - 11/03/20 1710    Visit Number 1    Number of Visits 16    Date for PT Re-Evaluation 12/29/20    PT Start Time 0800    PT Stop Time 4970    PT Time Calculation (min) 44 min    Activity Tolerance Patient tolerated treatment well;No increased pain    Behavior During Therapy WFL for tasks assessed/performed           Past Medical History:  Diagnosis Date  . Arthritis    wrist  . Hypertension     Past Surgical History:  Procedure Laterality Date  . cataracts Bilateral    with lens  . COLONOSCOPY W/ POLYPECTOMY      x2 -'06x1 polyp  . COLONOSCOPY WITH PROPOFOL N/A 02/24/2015   Procedure: COLONOSCOPY WITH PROPOFOL;  Surgeon: Garlan Fair, MD;  Location: [facility];  Service: Endoscopy;  Laterality: N/A;  . ESOPHAGOGASTRODUODENOSCOPY (EGD) WITH PROPOFOL N/A 02/24/2015   Procedure: ESOPHAGOGASTRODUODENOSCOPY (EGD) WITH PROPOFOL;  Surgeon: Garlan Fair, MD;  Location: [facility];  Service: Endoscopy;  Laterality: N/A;    There were no vitals filed for this visit.    Subjective Assessment - 11/03/20 1706    Subjective Gabrielle [facility]) reports she has L gluteal/ishial pain after sitting/leaning on a hard seat for an hour at a social engagement several months ago.  Symptoms have not improved since that time.    Pertinent History Arthritis, HTN    Limitations Sitting;House hold activities;Lifting;Standing;Walking    Patient Stated Goals Be able to return to her previous level of function (including walking for exercise) without pain.    Currently in Pain? Yes    Pain Score 4     Pain Location Buttocks    Pain Orientation Left     Pain Descriptors / Indicators Aching;Constant;Sore    Pain Type Chronic pain    Pain Radiating Towards NA    Pain Onset More than a month ago    Pain Frequency Constant    Aggravating Factors  Sitting, standing and walking    Pain Relieving Factors Supine postures    Effect of Pain on Daily Activities Unable to walk for exercises and limits WB function    Multiple Pain Sites No              [facility] PT Assessment - 11/03/20 0001      Assessment   Medical Diagnosis Piriformis Syndrome    Referring Provider (PT) Briscoe Deutscher    Onset Date/Surgical Date 08/05/20      Balance Screen   Has the patient fallen in the past 6 months No    Has the patient had a decrease in activity level because of a fear of falling?  No    Is the patient reluctant to leave their home because of a fear of falling?  No      Home Environment   Living Environment Private residence    Available Help at Discharge Family      Prior Function   Level of Independence Independent    Vocation Requirements Walking for exercises, run travel agency      Cognition   Overall Cognitive Status  Within Functional Limits for tasks assessed      Observation/Other Assessments   Focus on Therapeutic Outcomes (FOTO)  52 (Goal 72)      ROM / Strength   AROM / PROM / Strength AROM;Strength      AROM   Overall AROM  Deficits    AROM Assessment Site Hip    Right/Left Hip Left;Right    Right Hip Flexion 110    Right Hip External Rotation  28    Right Hip Internal Rotation  15    Left Hip Flexion 110    Left Hip External Rotation  28    Left Hip Internal Rotation  4      Flexibility   Soft Tissue Assessment /Muscle Length yes    Hamstrings 40 degrees L/45 degrees R                      Objective measurements completed on examination: See above findings.       [facility] Adult PT Treatment/Exercise - 11/03/20 0001      Exercises   Exercises Knee/Hip      Knee/Hip Exercises: Stretches    Piriformis Stretch Both;4 reps;20 seconds;Other (comment)   Figure 4 with overpressure   Other Knee/Hip Stretches Gluteal stretch 4X 20 seconds B      Knee/Hip Exercises: Supine   Bridges Strengthening;Both;1 set;10 reps;Other (comment)   5 seconds     Knee/Hip Exercises: Prone   Straight Leg Raises Strengthening;Both;1 set;10 reps;Other (comment)   3 seconds                 PT Education - 11/03/20 1710    Education Details Reviewed exam findings and started HEP to address impairments noted today.    Person(s) Educated Patient    Methods Explanation;Demonstration;Tactile cues;Verbal cues;Handout    Comprehension Verbal cues required;Tactile cues required;Returned demonstration;Verbalized understanding;Need further instruction            PT Short Term Goals - 11/03/20 1716      PT SHORT TERM GOAL #1   Title Gabrielle Glover will be independent with her starter HEP.    Time 2    Period Weeks    Status New    Target Date 11/17/20             PT Long Term Goals - 11/03/20 1720      PT LONG TERM GOAL #1   Title Gabrielle Glover will improve her FOTO score to 72.    Baseline 52    Time 8    Period Weeks    Status New    Target Date 12/29/20      PT LONG TERM GOAL #2   Title Gabrielle Glover will be able to sit, stand and walk for an hour or more without needing to take a break and without increasing L hip/ischial/gluteal pain.    Baseline Unable    Time 8    Period Weeks    Status New    Target Date 12/29/20      PT LONG TERM GOAL #3   Title Gabrielle Glover will be independent with her DC HEP.    Time 8    Period Weeks    Status New    Target Date 12/29/20                  Plan - 11/03/20 1711    Clinical Impression Statement Gabrielle [facility]) has L ischial/gluteal pain after leaning/sitting on a hard surface at a  Christmas Party for an hour.  Symptoms do not appear to be getting better on their own.  Some muscle guarding and hip IR AROM impairments were noted.  She has not been able to  stand or walk without discomfort and she has not been able to walk for exercise as she once was able to pre-episode.  Gabrielle Glover's prognosis to meet long-term goals is good with the prescribed plan of care.    Examination-Activity Limitations Stairs;Locomotion Level;Stand;Sit    Examination-Participation Restrictions Community Activity    Stability/Clinical Decision Making Stable/Uncomplicated    Clinical Decision Making Low    Rehab Potential Good    PT Frequency 2x / week    PT Duration 8 weeks    PT Treatment/Interventions ADLs/Self Care Home Management;Cryotherapy;Moist Heat;Iontophoresis 4mg /ml Dexamethasone;Electrical Stimulation;Gait training;Stair training;Neuromuscular re-education;Therapeutic exercise;Therapeutic activities;Patient/family education;Manual techniques;Dry needling    PT Next Visit Plan Check progress with HEP.  Progress gluteal and general hip/LE strength.  Consider modalities, dry needling as needed.    PT Home Exercise Plan Access Code: [email or web address]    Consulted and Agree with Plan of Care Patient           Patient will benefit from skilled therapeutic intervention in order to improve the following deficits and impairments:  Decreased activity tolerance,Decreased endurance,Decreased range of motion,Decreased strength,Difficulty walking,Increased edema,Impaired flexibility,Increased muscle spasms,Postural dysfunction,Pain  Visit Diagnosis: Difficulty in walking, not elsewhere classified  Muscle weakness (generalized)  Stiffness of left hip, not elsewhere classified  Pain in left hip     Problem List There are no problems to display for this patient.   Farley Ly PT, MPT 11/03/2020, 5:23 PM  [facility] [address] [place], [place], [address] Phone: [phone]   Fax:  [phone]  Name: Gabrielle [facility] MRN: [id number] Date of Birth: 08-10-1941

## 2020-11-16 ENCOUNTER — Encounter: Payer: Medicare Other | Admitting: Physical Therapy

## 2020-11-16 ENCOUNTER — Telehealth: Payer: Self-pay | Admitting: Physical Therapy

## 2020-11-16 NOTE — Telephone Encounter (Signed)
Called pt after she missed her Physical Therapy appointment his morning. I left message reminding pt of her next appointment on 11/23/2020 at 1:00.   Kearney Hard, PT, MPT 11/16/20 12:19 PM

## 2020-11-23 ENCOUNTER — Encounter: Payer: Self-pay | Admitting: Rehabilitative and Restorative Service Providers"

## 2020-11-23 ENCOUNTER — Ambulatory Visit: Payer: Medicare Other | Admitting: Rehabilitative and Restorative Service Providers"

## 2020-11-23 ENCOUNTER — Other Ambulatory Visit: Payer: Self-pay

## 2020-11-23 DIAGNOSIS — M25552 Pain in left hip: Secondary | ICD-10-CM

## 2020-11-23 DIAGNOSIS — M6281 Muscle weakness (generalized): Secondary | ICD-10-CM

## 2020-11-23 DIAGNOSIS — R262 Difficulty in walking, not elsewhere classified: Secondary | ICD-10-CM | POA: Diagnosis not present

## 2020-11-23 DIAGNOSIS — M25652 Stiffness of left hip, not elsewhere classified: Secondary | ICD-10-CM

## 2020-11-23 NOTE — Patient Instructions (Signed)
Access Code: [email or web address] URL: [email or web address] Date: 11/23/2020 Prepared by: Vista Mink  Exercises Supine Figure 4 Piriformis Stretch - 2-3 x daily - 7 x weekly - 1 sets - 5 reps - 20 seconds hold Supine Gluteus Stretch - 2-3 x daily - 7 x weekly - 1 sets - 5 reps - 20 seconds hold Prone Hip Extension - 2 x daily - 7 x weekly - 2 sets - 10 reps - 3 seconds hold Bridge - 2 x daily - 7 x weekly - 2 sets - 10 reps - 5 seconds hold Standing Lumbar Extension at [place] - 5 x daily - 7 x weekly - 1 sets - 5 reps - 3 seconds hold Sit to Stand with Armchair - 2 x daily - 7 x weekly - 1 sets - 5 reps

## 2020-11-23 NOTE — Therapy (Signed)
[facility] [address] [place], [place], [address] Phone: [phone]   Fax:  [phone]  Physical Therapy Treatment/Reassessment  Patient Details  Name: Gabrielle [facility] MRN: [id number] Date of Birth: 06-30-1941 Referring Provider (PT): Briscoe Deutscher   Encounter Date: 11/23/2020   PT End of Session - 11/23/20 1624    Visit Number 2    Number of Visits 16    Date for PT Re-Evaluation 12/29/20    PT Start Time 1300    PT Stop Time 1345    PT Time Calculation (min) 45 min    Activity Tolerance Patient tolerated treatment well;No increased pain    Behavior During Therapy WFL for tasks assessed/performed           Past Medical History:  Diagnosis Date  . Arthritis    wrist  . Hypertension     Past Surgical History:  Procedure Laterality Date  . cataracts Bilateral    with lens  . COLONOSCOPY W/ POLYPECTOMY      x2 -'06x1 polyp  . COLONOSCOPY WITH PROPOFOL N/A 02/24/2015   Procedure: COLONOSCOPY WITH PROPOFOL;  Surgeon: Garlan Fair, MD;  Location: [facility];  Service: Endoscopy;  Laterality: N/A;  . ESOPHAGOGASTRODUODENOSCOPY (EGD) WITH PROPOFOL N/A 02/24/2015   Procedure: ESOPHAGOGASTRODUODENOSCOPY (EGD) WITH PROPOFOL;  Surgeon: Garlan Fair, MD;  Location: [facility];  Service: Endoscopy;  Laterality: N/A;    There were no vitals filed for this visit.   Subjective Assessment - 11/23/20 1315    Subjective [name] notes an "80% improvement" since starting PT.  Some soreness is noted in the morning but symptoms are relieved with stretching.    Pertinent History Arthritis, HTN    Limitations Sitting;House hold activities;Lifting;Standing;Walking    Patient Stated Goals Be able to return to her previous level of function (including walking for exercise) without pain.    Currently in Pain? No/denies    Pain Onset More than a month ago              [facility] PT Assessment - 11/23/20 0001      Observation/Other  Assessments   Focus on Therapeutic Outcomes (FOTO)  70 (was 52, Goal 72)      ROM / Strength   AROM / PROM / Strength AROM;Strength      AROM   Overall AROM  Deficits    AROM Assessment Site Hip    Right/Left Hip Left;Right    Right Hip Flexion 110    Right Hip External Rotation  32    Right Hip Internal Rotation  5    Left Hip Flexion 110    Left Hip External Rotation  32    Left Hip Internal Rotation  5      Flexibility   Soft Tissue Assessment /Muscle Length yes    Hamstrings 50 degrees/50 degrees                         [facility] Adult PT Treatment/Exercise - 11/23/20 0001      Exercises   Exercises Knee/Hip      Knee/Hip Exercises: Stretches   Piriformis Stretch Both;4 reps;20 seconds;Other (comment)   Figure 4 with overpressure   Other Knee/Hip Stretches Gluteal stretch 4X 20 seconds B      Knee/Hip Exercises: Standing   Other Standing Knee Exercises Trunk extension AROM 10X 3 seconds      Knee/Hip Exercises: Seated   Sit to Sand 10 reps;without UE support  slow eccentrics     Knee/Hip Exercises: Supine   Bridges Strengthening;Both;1 set;10 reps;Other (comment)   5 seconds     Knee/Hip Exercises: Prone   Straight Leg Raises Strengthening;Both;1 set;10 reps;Other (comment)   3 seconds                 PT Education - 11/23/20 1623    Education Details Reviewed reassessment findings and HEP.  Updated HEP.    Person(s) Educated Patient    Methods Explanation;Demonstration;Verbal cues;Handout    Comprehension Verbalized understanding;Verbal cues required;Returned demonstration            PT Short Term Goals - 11/23/20 1624      PT SHORT TERM GOAL #1   Title Gabrielle Glover will be independent with her starter HEP.    Time 2    Period Weeks    Status Achieved    Target Date 11/17/20             PT Long Term Goals - 11/23/20 1342      PT LONG TERM GOAL #1   Title Gabrielle Glover will improve her FOTO score to 72.    Baseline 70 (was 52 at  evaluation)    Time 8    Period Weeks    Status On-going      PT LONG TERM GOAL #2   Title Gabrielle Glover will be able to sit, stand and walk for an hour or more without needing to take a break and without increasing L hip/ischial/gluteal pain.    Baseline Unable    Time 8    Period Weeks    Status Achieved      PT LONG TERM GOAL #3   Title Gabrielle Glover will be independent with her DC HEP.    Time 8    Period Weeks    Status Achieved                 Plan - 11/23/20 1624    Clinical Impression Statement Gabrielle Glover reports an "80% improvement" in her symptoms since starting physical therapy.  Because she is headed to [facility] for over a week, she will continue her exercises independently.  If she is comfortable with no flare-ups with travel, she will continue her exercises independently.  If she has any difficulty, she will return to supervised physical therapy by May 12.  HEP was updated based on today's exam findings.    Examination-Activity Limitations Stairs;Locomotion Level;Stand;Sit    Examination-Participation Restrictions Community Activity    Stability/Clinical Decision Making Stable/Uncomplicated    Rehab Potential Good    PT Frequency 2x / week    PT Duration 8 weeks    PT Treatment/Interventions ADLs/Self Care Home Management;Cryotherapy;Moist Heat;Iontophoresis 4mg /ml Dexamethasone;Electrical Stimulation;Gait training;Stair training;Neuromuscular re-education;Therapeutic exercise;Therapeutic activities;Patient/family education;Manual techniques;Dry needling    PT Next Visit Plan Check progress with HEP.  Progress gluteal and general hip/LE strength.  Consider modalities, dry needling as needed.    PT Home Exercise Plan Access Code: [email or web address]    Consulted and Agree with Plan of Care Patient           Patient will benefit from skilled therapeutic intervention in order to improve the following deficits and impairments:  Decreased activity tolerance,Decreased endurance,Decreased range of  motion,Decreased strength,Difficulty walking,Increased edema,Impaired flexibility,Increased muscle spasms,Postural dysfunction,Pain  Visit Diagnosis: Difficulty in walking, not elsewhere classified  Muscle weakness (generalized)  Stiffness of left hip, not elsewhere classified  Pain in left hip     Problem List There are no problems to display  for this patient.   Farley Ly PT, MPT 11/23/2020, 4:27 PM  [facility] [address] [place], [place], [address] Phone: [phone]   Fax:  [phone]  Name: Gabrielle [facility] MRN: [id number] Date of Birth: 03-12-1941

## 2020-11-30 ENCOUNTER — Telehealth: Payer: Self-pay | Admitting: Rehabilitative and Restorative Service Providers"

## 2020-11-30 ENCOUNTER — Encounter: Payer: Medicare Other | Admitting: Rehabilitative and Restorative Service Providers"

## 2020-11-30 NOTE — Telephone Encounter (Signed)
No show.  Left VM asking her to call and confirm or cancel 4/27 appointment.  Left [phone] direct number on VM.

## 2020-12-07 ENCOUNTER — Encounter: Payer: Self-pay | Admitting: Physical Therapy

## 2020-12-07 ENCOUNTER — Ambulatory Visit: Payer: Medicare Other | Admitting: Physical Therapy

## 2020-12-07 ENCOUNTER — Other Ambulatory Visit: Payer: Self-pay

## 2020-12-07 DIAGNOSIS — M25652 Stiffness of left hip, not elsewhere classified: Secondary | ICD-10-CM | POA: Diagnosis not present

## 2020-12-07 DIAGNOSIS — R262 Difficulty in walking, not elsewhere classified: Secondary | ICD-10-CM | POA: Diagnosis not present

## 2020-12-07 DIAGNOSIS — M6281 Muscle weakness (generalized): Secondary | ICD-10-CM | POA: Diagnosis not present

## 2020-12-07 DIAGNOSIS — M25552 Pain in left hip: Secondary | ICD-10-CM

## 2020-12-07 NOTE — Therapy (Addendum)
[facility] [address] [place], [place], [address] Phone: [phone]   Fax:  [phone]  Physical Therapy Treatment  Patient Details  Name: Gabrielle [facility] MRN: [id number] Date of Birth: Oct 18, 1940 Referring Provider (PT): Briscoe Deutscher   Encounter Date: 12/07/2020   PT End of Session - 12/07/20 0933    Visit Number 3    Number of Visits 16    Date for PT Re-Evaluation 12/29/20    PT Start Time 0930    PT Stop Time 1010    PT Time Calculation (min) 40 min    Activity Tolerance Patient tolerated treatment well;No increased pain    Behavior During Therapy WFL for tasks assessed/performed           Past Medical History:  Diagnosis Date  . Arthritis    wrist  . Hypertension     Past Surgical History:  Procedure Laterality Date  . cataracts Bilateral    with lens  . COLONOSCOPY W/ POLYPECTOMY      x2 -'06x1 polyp  . COLONOSCOPY WITH PROPOFOL N/A 02/24/2015   Procedure: COLONOSCOPY WITH PROPOFOL;  Surgeon: Garlan Fair, MD;  Location: [facility];  Service: Endoscopy;  Laterality: N/A;  . ESOPHAGOGASTRODUODENOSCOPY (EGD) WITH PROPOFOL N/A 02/24/2015   Procedure: ESOPHAGOGASTRODUODENOSCOPY (EGD) WITH PROPOFOL;  Surgeon: Garlan Fair, MD;  Location: [facility];  Service: Endoscopy;  Laterality: N/A;    There were no vitals filed for this visit.   Subjective Assessment - 12/07/20 0933    Subjective Gabrielle Glover reporting no pain this morning, but reproting pain yesterday when she woke up in her right hamstrings.    Pertinent History Arthritis, HTN    Limitations Sitting;House hold activities;Lifting;Standing;Walking    Patient Stated Goals Be able to return to her previous level of function (including walking for exercise) without pain.    Currently in Pain? No/denies    Pain Location Buttocks    Pain Orientation Right    Pain Descriptors / Indicators Aching;Sore    Pain Type Chronic pain    Pain Onset More than a  month ago                             [facility] Adult PT Treatment/Exercise - 12/07/20 0001      Exercises   Exercises Knee/Hip      Knee/Hip Exercises: Stretches   Piriformis Stretch Both;4 reps;20 seconds;Other (comment)   Figure 4 with overpressure   Other Knee/Hip Stretches Gluteal stretch 4X 20 seconds B      Knee/Hip Exercises: Standing   Other Standing Knee Exercises Trunk extension AROM 10X 3 seconds      Knee/Hip Exercises: Seated   Sit to Sand 10 reps;without UE support   slow eccentrics     Knee/Hip Exercises: Supine   Bridges with [employer] Strengthening;2 sets;10 reps      Knee/Hip Exercises: Sidelying   Hip ABduction Strengthening;Right;15 reps;Limitations    Hip ABduction Limitations holding 3 seconds      Knee/Hip Exercises: Prone   Hamstring Curl 2 sets;10 reps    Hamstring Curl Limitations Level 2 theraband    Straight Leg Raises Strengthening;Both;1 set;10 reps;Other (comment)   3 seconds   Other Prone Exercises quadraped: cat/camel x 10 hodling 5 seconds each      Manual Therapy   Manual therapy comments 8 minutes: Percussion to right piriformis and glutes, active trigger point release to right piriformis, instructed pt in  self soft tissue mobs using tennis ball                  PT Education - 12/07/20 1018    Education Details added cat/camel and prone hamstring curls to pt's HEP    Person(s) Educated Patient    Methods Explanation;Demonstration;Verbal cues    Comprehension Returned demonstration;Verbalized understanding            PT Short Term Goals - 12/07/20 0943      PT SHORT TERM GOAL #1   Title Gabrielle Glover will be independent with her starter HEP.    Status Achieved             PT Long Term Goals - 12/07/20 0943      PT LONG TERM GOAL #1   Title Gabrielle Glover will improve her FOTO score to 72.    Status On-going      PT LONG TERM GOAL #2   Title Gabrielle Glover will be able to sit, stand and walk for an hour or more without  needing to take a break and without increasing L hip/ischial/gluteal pain.    Status Achieved      PT LONG TERM GOAL #3   Title Gabrielle Glover will be independent with her DC HEP.    Status Achieved      PT LONG TERM GOAL #4   Title Pt will report pain </= 2/10 in R gluteal and hamstring after a full days activities including walking >/= 1 mile.    Time 4    Period Weeks    Status New    Target Date 12/29/20                 Plan - 12/07/20 1950    Clinical Impression Statement Pt arriving today reporting no pain today but still presenting with intermittent pain in her R LE from her gluteals to hamstrings. Pt has been doing her HEP and reporting relief following stretching. Pt did not go on her trip to [facility] due to her husband falling, but reports she is going to [place] in two weeks. PT tolerating LE and core strengthening and stretching well.  Pt was instructed in how to utilize a tennis ball to help with active trigger point release in her right  piriformis.  Pt could benefit from further skilled PT to progress toward her initial goals set.    Examination-Activity Limitations Stairs;Locomotion Level;Stand;Sit    Examination-Participation Restrictions Community Activity    Stability/Clinical Decision Making Stable/Uncomplicated    Rehab Potential Good    PT Frequency 2x / week    PT Duration 8 weeks    PT Treatment/Interventions ADLs/Self Care Home Management;Cryotherapy;Moist Heat;Iontophoresis 4mg /ml Dexamethasone;Electrical Stimulation;Gait training;Stair training;Neuromuscular re-education;Therapeutic exercise;Therapeutic activities;Patient/family education;Manual techniques;Dry needling    PT Next Visit Plan Progress gluteal and general hip/LE strength, core strengthening, Consider dry needling if pain intensity increases again    PT Home Exercise Plan Access Code: [email or web address]    Consulted and Agree with Plan of Care Patient           Patient will benefit from skilled therapeutic  intervention in order to improve the following deficits and impairments:  Decreased activity tolerance,Decreased endurance,Decreased range of motion,Decreased strength,Difficulty walking,Increased edema,Impaired flexibility,Increased muscle spasms,Postural dysfunction,Pain  Visit Diagnosis: Difficulty in walking, not elsewhere classified  Muscle weakness (generalized)  Stiffness of left hip, not elsewhere classified  Pain in left hip     Problem List There are no problems to display for this patient.   Anderson Malta  Cherylynn Ridges, PT, MPT 12/07/2020, 10:19 AM  [facility] [address] [place], [place], [address] Phone: [phone]   Fax:  [phone]  Name: Gabrielle [facility] MRN: [id number] Date of Birth: 1941-07-14

## 2020-12-07 NOTE — Patient Instructions (Signed)
Access Code: [email or web address] URL: [email or web address] Date: 12/07/2020 Prepared by: Kearney Hard  Exercises Supine Figure 4 Piriformis Stretch - 2-3 x daily - 7 x weekly - 1 sets - 5 reps - 20 seconds hold Supine Gluteus Stretch - 2-3 x daily - 7 x weekly - 1 sets - 5 reps - 20 seconds hold Prone Hip Extension - 2 x daily - 7 x weekly - 2 sets - 10 reps - 3 seconds hold Bridge - 2 x daily - 7 x weekly - 2 sets - 10 reps - 5 seconds hold Standing Lumbar Extension at [place] - 5 x daily - 7 x weekly - 1 sets - 5 reps - 3 seconds hold Sit to Stand with Armchair - 2 x daily - 7 x weekly - 1 sets - 5 reps Cat-Camel - 2 x daily - 7 x weekly - 10-15 reps - 5 seconds hold Prone Knee Flexion - 2 x daily - 7 x weekly - 2 sets - 10 reps

## 2020-12-20 ENCOUNTER — Encounter: Payer: Self-pay | Admitting: Orthopaedic Surgery

## 2020-12-20 ENCOUNTER — Ambulatory Visit: Payer: Medicare Other | Admitting: Orthopaedic Surgery

## 2020-12-20 DIAGNOSIS — G5701 Lesion of sciatic nerve, right lower limb: Secondary | ICD-10-CM | POA: Diagnosis not present

## 2020-12-20 MED ORDER — PREDNISONE 10 MG (21) PO TBPK: ORAL_TABLET | ORAL | 0 refills | Status: AC

## 2020-12-20 NOTE — Progress Notes (Signed)
   Office Visit Note   Patient: Gabrielle Glover           Date of Birth: 1941/04/17           MRN: [id number] Visit Date: 12/20/2020              Requested by: Crist Infante, MD [address] [place],  [place] [address] PCP: Crist Infante, MD   Assessment & Plan: Visit Diagnoses:  1. Piriformis syndrome of right side     Plan: Gabrielle Glover basically wants a prescription for prednisone to have while she is on her trip.  Overall she is doing well and is happy with the progress that she has made.  She will continue with PT.  Follow-Up Instructions: Return if symptoms worsen or fail to improve.   Orders:  No orders of the defined types were placed in this encounter.  Meds ordered this encounter  Medications  . predniSONE (STERAPRED UNI-PAK 21 TAB) 10 MG (21) TBPK tablet    Sig: Take as directed    Dispense:  21 tablet    Refill:  0      Procedures: No procedures performed   Clinical Data: No additional findings.   Subjective: Chief Complaint  Patient presents with  . RIGHT BUTTOCK PAIN    Gabrielle Glover returns today for follow-up of her right piriformis syndrome.  She is doing much better and physical therapy has helped.  She also notes that the prednisone has helped.  She is scheduled to go to [place] in a couple weeks.   Review of Systems   Objective: Vital Signs: There were no vitals taken for this visit.  Physical Exam  Ortho Exam Right hip exam is fairly benign.  Slight tenderness in the posterior lateral region near the piriformis.  Good range of motion and good strength and ambulation otherwise. Specialty Comments:  No specialty comments available.  Imaging: No results found.   PMFS History: There are no problems to display for this patient.  Past Medical History:  Diagnosis Date  . Arthritis    wrist  . Hypertension     History reviewed. No pertinent family history.  Past Surgical History:  Procedure Laterality Date  . cataracts Bilateral     with lens  . COLONOSCOPY W/ POLYPECTOMY      x2 -'06x1 polyp  . COLONOSCOPY WITH PROPOFOL N/A 02/24/2015   Procedure: COLONOSCOPY WITH PROPOFOL;  Surgeon: Garlan Fair, MD;  Location: [facility];  Service: Endoscopy;  Laterality: N/A;  . ESOPHAGOGASTRODUODENOSCOPY (EGD) WITH PROPOFOL N/A 02/24/2015   Procedure: ESOPHAGOGASTRODUODENOSCOPY (EGD) WITH PROPOFOL;  Surgeon: Garlan Fair, MD;  Location: [facility];  Service: Endoscopy;  Laterality: N/A;   Social History   Occupational History  . Not on file  Tobacco Use  . Smoking status: Former Smoker    Years: 10.00    Quit date: 02/15/1982    Years since quitting: 38.8  . Smokeless tobacco: Not on file  . Tobacco comment: off and on smother, Quit 30 yrs ago.  Substance and Sexual Activity  . Alcohol use: Yes    Comment: wine daily   . Drug use: No  . Sexual activity: Not on file

## 2020-12-21 ENCOUNTER — Ambulatory Visit: Payer: Medicare Other | Admitting: Physical Therapy

## 2020-12-21 ENCOUNTER — Encounter: Payer: Self-pay | Admitting: Physical Therapy

## 2020-12-21 ENCOUNTER — Other Ambulatory Visit: Payer: Self-pay

## 2020-12-21 DIAGNOSIS — M6281 Muscle weakness (generalized): Secondary | ICD-10-CM | POA: Diagnosis not present

## 2020-12-21 DIAGNOSIS — R262 Difficulty in walking, not elsewhere classified: Secondary | ICD-10-CM

## 2020-12-21 DIAGNOSIS — M25552 Pain in left hip: Secondary | ICD-10-CM | POA: Diagnosis not present

## 2020-12-21 DIAGNOSIS — M25652 Stiffness of left hip, not elsewhere classified: Secondary | ICD-10-CM

## 2020-12-21 NOTE — Therapy (Addendum)
[facility] Physical Therapy [address] [place], [place], [address] Phone: [phone]   Fax:  [phone]  Physical Therapy Treatment  Patient Details  Name: Gabrielle Glover MRN: [id number] Date of Birth: Jul 02, 1941 Referring Provider (PT): Briscoe Deutscher   Encounter Date: 12/21/2020   PT End of Session - 12/21/20 0810    Visit Number 4    Number of Visits 16    Date for PT Re-Evaluation 12/29/20    PT Start Time 0800    PT Stop Time 0845    PT Time Calculation (min) 45 min    Activity Tolerance Patient tolerated treatment well;No increased pain    Behavior During Therapy WFL for tasks assessed/performed           Past Medical History:  Diagnosis Date  . Arthritis    wrist  . Hypertension     Past Surgical History:  Procedure Laterality Date  . cataracts Bilateral    with lens  . COLONOSCOPY W/ POLYPECTOMY      x2 -'06x1 polyp  . COLONOSCOPY WITH PROPOFOL N/A 02/24/2015   Procedure: COLONOSCOPY WITH PROPOFOL;  Surgeon: Garlan Fair, MD;  Location: [facility];  Service: Endoscopy;  Laterality: N/A;  . ESOPHAGOGASTRODUODENOSCOPY (EGD) WITH PROPOFOL N/A 02/24/2015   Procedure: ESOPHAGOGASTRODUODENOSCOPY (EGD) WITH PROPOFOL;  Surgeon: Garlan Fair, MD;  Location: [facility];  Service: Endoscopy;  Laterality: N/A;    There were no vitals filed for this visit.   Subjective Assessment - 12/21/20 0808    Subjective Pt arriving to therapy reporting 5/10 in right glutes. Pt did reporting having a rough day yesterday.    Limitations Sitting;House hold activities;Lifting;Standing;Walking    Patient Stated Goals Be able to return to her previous level of function (including walking for exercise) without pain.    Currently in Pain? Yes    Pain Score 5     Pain Location Buttocks    Pain Orientation Right    Pain Descriptors / Indicators Aching;Sore    Pain Type Chronic pain    Pain Onset More than a month ago                              [facility] Adult PT Treatment/Exercise - 12/21/20 0001      Exercises   Exercises Knee/Hip      Knee/Hip Exercises: Stretches   Piriformis Stretch Both;4 reps;20 seconds;Other (comment)   Figure 4 with overpressure   Other Knee/Hip Stretches Gluteal stretch 4X 20 seconds B      Knee/Hip Exercises: Aerobic   Nustep L5 x 5 minutes      Knee/Hip Exercises: Standing   Other Standing Knee Exercises Trunk extension AROM 10X 3 seconds      Knee/Hip Exercises: Supine   Bridges with Ball Squeeze Strengthening;2 sets;10 reps      Knee/Hip Exercises: Sidelying   Hip ABduction Strengthening;Right;15 reps;Limitations    Hip ABduction Limitations holding 3 seconds      Knee/Hip Exercises: Prone   Hamstring Curl 2 sets;10 reps    Hamstring Curl Limitations Level 2 theraband    Straight Leg Raises Strengthening;Both;1 set;10 reps;Other (comment)   3 seconds   Other Prone Exercises quadraped: cat/camel x 10 hodling 5 seconds each      Modalities   Modalities Moist Heat      Moist Heat Therapy   Number Minutes Moist Heat 10 Minutes    Moist Heat Location Hip  Manual Therapy   Manual therapy comments 8 minutes percussion and IASTM to right pirifromis, hamstrings and IT band                    PT Short Term Goals - 12/21/20 0837      PT SHORT TERM GOAL #1   Title [name] will be independent with her starter HEP.    Status Achieved             PT Long Term Goals - 12/21/20 0837      PT LONG TERM GOAL #1   Title [name] will improve her FOTO score to 72.    Status On-going      PT LONG TERM GOAL #3   Title [name] will be independent with her DC HEP.    Status On-going      PT LONG TERM GOAL #4   Title Pt will report pain </= 2/10 in R gluteal and hamstring after a full days activities including walking >/= 1 mile.    Status On-going                 Plan - 12/21/20 0810    Clinical Impression Statement Pt arriving today  with 5/10 pain in right gluteal region. Pt stated she has been using her tennis ball and percussor for soft tissue mobs which helps at home.  Pt tolerating exercises well with no increase in pain or symptoms reported. Pt reporting relief following IASTM and percussion to right piriformis, IT band, and hamstrings. Continue skilled PT to maximize function.    Examination-Activity Limitations Stairs;Locomotion Level;Stand;Sit    Examination-Participation Restrictions Community Activity    Stability/Clinical Decision Making Stable/Uncomplicated    Rehab Potential Good    PT Frequency 2x / week    PT Duration 8 weeks    PT Treatment/Interventions ADLs/Self Care Home Management;Cryotherapy;Moist Heat;Iontophoresis 4mg /ml Dexamethasone;Electrical Stimulation;Gait training;Stair training;Neuromuscular re-education;Therapeutic exercise;Therapeutic activities;Patient/family education;Manual techniques;Dry needling    PT Next Visit Plan Progress gluteal and general hip/LE strength, core strengthening, Consider dry needling if pain intensity increases again    PT Home Exercise Plan Access Code: [email or web address]    Consulted and Agree with Plan of Care Patient           Patient will benefit from skilled therapeutic intervention in order to improve the following deficits and impairments:  Decreased activity tolerance,Decreased endurance,Decreased range of motion,Decreased strength,Difficulty walking,Increased edema,Impaired flexibility,Increased muscle spasms,Postural dysfunction,Pain  Visit Diagnosis: Difficulty in walking, not elsewhere classified  Muscle weakness (generalized)  Stiffness of left hip, not elsewhere classified  Pain in left hip     Problem List There are no problems to display for this patient.   Oretha Caprice PT, MPT 12/21/2020, 8:43 AM  [facility] Physical Therapy [address] [place], [place], [address] Phone: [phone]   Fax:  [phone]  Name:  Gabrielle Glover MRN: [id number] Date of Birth: 1940/10/22

## 2020-12-26 ENCOUNTER — Encounter: Payer: Medicare Other | Admitting: Physical Therapy

## 2020-12-28 ENCOUNTER — Encounter: Payer: Self-pay | Admitting: Physical Therapy

## 2020-12-28 ENCOUNTER — Ambulatory Visit: Payer: Medicare Other | Admitting: Physical Therapy

## 2020-12-28 ENCOUNTER — Other Ambulatory Visit: Payer: Self-pay

## 2020-12-28 DIAGNOSIS — M25552 Pain in left hip: Secondary | ICD-10-CM | POA: Diagnosis not present

## 2020-12-28 DIAGNOSIS — M6281 Muscle weakness (generalized): Secondary | ICD-10-CM

## 2020-12-28 DIAGNOSIS — R262 Difficulty in walking, not elsewhere classified: Secondary | ICD-10-CM | POA: Diagnosis not present

## 2020-12-28 DIAGNOSIS — M25652 Stiffness of left hip, not elsewhere classified: Secondary | ICD-10-CM

## 2020-12-28 NOTE — Therapy (Addendum)
[facility] Physical Therapy [address] [place], [place], [address] Phone: [phone]   Fax:  [phone]  Physical Therapy Treatment/ Discharge   Patient Details  Name: Gabrielle Glover MRN: [id number] Date of Birth: [birth year] Referring Provider (PT): Aundra Dubin PA-C   Encounter Date: 12/28/2020   PT End of Session - 12/28/20 0815     Visit Number 5    Number of Visits 16    Date for PT Re-Evaluation 12/29/20    PT Start Time 0802    PT Stop Time 0845    PT Time Calculation (min) 43 min    Activity Tolerance Patient tolerated treatment well;No increased pain    Behavior During Therapy [facility] for tasks assessed/performed             Past Medical History:  Diagnosis Date   Arthritis    wrist   Hypertension     Past Surgical History:  Procedure Laterality Date   cataracts Bilateral    with lens   COLONOSCOPY W/ POLYPECTOMY      x2 -'06x1 polyp   COLONOSCOPY WITH PROPOFOL N/A 02/24/2015   Procedure: COLONOSCOPY WITH PROPOFOL;  Surgeon: Garlan Fair, MD;  Location: [facility];  Service: Endoscopy;  Laterality: N/A;   ESOPHAGOGASTRODUODENOSCOPY (EGD) WITH PROPOFOL N/A 02/24/2015   Procedure: ESOPHAGOGASTRODUODENOSCOPY (EGD) WITH PROPOFOL;  Surgeon: Garlan Fair, MD;  [facility]: [facility];  Service: Endoscopy;  Laterality: N/A;    There were no vitals filed for this visit.   Subjective Assessment - 12/28/20 0809     Subjective Pt arriving today reporting fatigue after climbing the flight of stairs getting to clinic on 2nd floor. Pt reporting 2-3/10 pain in right hip/buttock    Pertinent History Arthritis, HTN    Limitations Sitting;House hold activities;Lifting;Standing;Walking    Patient Stated Goals Be able to return to her previous level of function (including walking for exercise) without pain.    Currently in Pain? Yes    Pain Score 3     Pain Location Buttocks    Pain Orientation Right    Pain Descriptors / Indicators  Aching;Sore    Pain Type Chronic pain    Pain Onset More than a month ago                [facility] PT Assessment - 12/28/20 0001       Assessment   Medical Diagnosis pirifromis syndrome    Referring Provider (PT) Briscoe Deutscher PA-C      Observation/Other Assessments   Focus on Therapeutic Outcomes (FOTO)  63 (goal is 72)      AROM   Right Hip Flexion 112    Right Hip External Rotation  34    Right Hip Internal Rotation  10                           [facility] Adult PT Treatment/Exercise - 12/28/20 0001       Exercises   Exercises Knee/Hip      Knee/Hip Exercises: Stretches   Piriformis Stretch Both;4 reps;20 seconds;Other (comment)   Figure 4 with overpressure   Other Knee/Hip Stretches Gluteal stretch 4X 20 seconds B      Knee/Hip Exercises: Aerobic   Recumbent Bike L3 x 6 minutes      Knee/Hip Exercises: Seated   Sit to Sand 10 reps      Knee/Hip Exercises: Supine   Bridges with Clamshell Strengthening;10 reps;2 sets  Knee/Hip Exercises: Sidelying   Hip ABduction Strengthening;Right;15 reps;Limitations    Hip ABduction Limitations holding 3 seconds      Knee/Hip Exercises: Prone   Hamstring Curl 2 sets;10 reps    Hamstring Curl Limitations Level 2 theraband    Straight Leg Raises Strengthening;Both;1 set;10 reps;Other (comment)   3 seconds   Other Prone Exercises childs pose    Other Prone Exercises quadraped: cat/camel x 10 hodling 5 seconds each      Modalities   Modalities Moist Heat      Moist Heat Therapy   Number Minutes Moist Heat 5 Minutes    Moist Heat Location Hip   gluteals     Manual Therapy   Manual therapy comments IASTM to right piriformis and IT band                      PT Short Term Goals - 12/28/20 0825       PT SHORT TERM GOAL #1   Title Gabrielle Glover will be independent with her starter HEP.    Status Achieved               PT Long Term Goals - 12/28/20 0825       PT LONG TERM GOAL #1    Title Gabrielle Glover will improve her FOTO score to 72.    Status On-going      PT LONG TERM GOAL #2   Baseline Pt feels she could walk 1 hour but it will be with pain. Pt is currently walking a mile.    Status Partially Met      PT LONG TERM GOAL #3   Title Gabrielle Glover will be independent with her DC HEP.    Status On-going      PT LONG TERM GOAL #4   Title Pt will report pain </= 2/10 in R gluteal and hamstring after a full days activities including walking >/= 1 mile.    Baseline Pt able to walk 1 mile but with pain > 2/10.    Status Partially Met                   Plan - 12/28/20 0818     Clinical Impression Statement Pt reporting 3/10 pain in right gluteal region/piriformis. Pt reporting feeling better since last visit with just a few sharp pain episodes after dinner. Pt reporting she has been able to walk a few evening about 1 mile and she couldn't do that a few weeks ago. Pt tolerating stretching and strengthening exercises well and compliance in her HEP. Pt reporting overall improvements since starting therapy. Pt has improved in her AROM and everyday function.  Pt was instructed to make sure she changed positions often on her fight to [place] on Sunday. IASTM to right piriformis and IT band. I am placing pt on hold for 30 days and pt instructed to follow up with further appointments after two weeks if needed. Pt will need recert if she decides to return.    Examination-Activity Limitations Stairs;Locomotion Level;Stand;Sit    Examination-Participation Restrictions Community Activity    Stability/Clinical Decision Making Stable/Uncomplicated    Rehab Potential Good    PT Frequency 2x / week    PT Duration 8 weeks    PT Treatment/Interventions ADLs/Self Care Home Management;Cryotherapy;Moist Heat;Iontophoresis 41m/ml Dexamethasone;Electrical Stimulation;Gait training;Stair training;Neuromuscular re-education;Therapeutic exercise;Therapeutic activities;Patient/family education;Manual  techniques;Dry needling    PT Next Visit Plan 30 day hold    PT Home Exercise Plan Access Code: [email or web address]  Consulted and Agree with Plan of Care Patient             Patient will benefit from skilled therapeutic intervention in order to improve the following deficits and impairments:  Decreased activity tolerance,Decreased endurance,Decreased range of motion,Decreased strength,Difficulty walking,Increased edema,Impaired flexibility,Increased muscle spasms,Postural dysfunction,Pain  Visit Diagnosis: Difficulty in walking, not elsewhere classified  Muscle weakness (generalized)  Stiffness of left hip, not elsewhere classified  Pain in left hip     Problem List There are no problems to display for this patient.   Gabrielle Glover, PT, MPT 12/28/2020, 8:54 AM  PHYSICAL THERAPY DISCHARGE SUMMARY  Visits from Start of Care: 5  Current functional level related to goals / functional outcomes: See note   Remaining deficits: See note   Education / Equipment: HEP   Patient agrees to discharge. Patient goals were partially met. Patient is being discharged due to not returning since the last visit.  Gabrielle Glover, PT, DPT, OCS, ATC 01/31/21  1:54 PM     [facility] [address] [place], [place], [address] Phone: [phone]   Fax:  [phone]  Name: Gabrielle Glover MRN: [id number] Date of Birth: 1940-09-22

## 2021-04-25 ENCOUNTER — Other Ambulatory Visit: Payer: Self-pay

## 2021-04-25 ENCOUNTER — Ambulatory Visit: Payer: Self-pay

## 2021-04-25 ENCOUNTER — Ambulatory Visit: Payer: Medicare Other | Admitting: Orthopaedic Surgery

## 2021-04-25 ENCOUNTER — Encounter: Payer: Self-pay | Admitting: Orthopaedic Surgery

## 2021-04-25 DIAGNOSIS — M545 Low back pain, unspecified: Secondary | ICD-10-CM | POA: Diagnosis not present

## 2021-04-25 DIAGNOSIS — S76311D Strain of muscle, fascia and tendon of the posterior muscle group at thigh level, right thigh, subsequent encounter: Secondary | ICD-10-CM | POA: Diagnosis not present

## 2021-04-25 MED ORDER — TRAMADOL HCL 50 MG PO TABS: 50.0000 mg | ORAL_TABLET | Freq: Three times a day (TID) | ORAL | 2 refills | Status: AC | PRN

## 2021-04-25 NOTE — Progress Notes (Signed)
Office Visit Note   Patient: Gabrielle Glover           Date of Birth: 07/09/1941           MRN: [id number] Visit Date: 04/25/2021              Requested by: Crist Infante, MD [address] [place],  [place] [address] PCP: Crist Infante, MD   Assessment & Plan: Visit Diagnoses:  1. Strain of right hamstring, subsequent encounter   2. Low back pain, unspecified back pain laterality, unspecified chronicity, unspecified whether sciatica present     Plan: Impression is right proximal hamstring tendinosis.  We have discussed treatment options to include activity modification and NSAIDs as needed.  She will continue to do exercises at home.  She understands that there is no true fix for this problem.  I called in tramadol to take as well as needed for increased pain.  She will follow-up with [place] as needed.  Follow-Up Instructions: Return if symptoms worsen or fail to improve.   Orders:  Orders Placed This Encounter  Procedures   XR Lumbar Spine 2-3 Views   XR Pelvis 1-2 Views    Meds ordered this encounter  Medications   traMADol (ULTRAM) 50 MG tablet    Sig: Take 1 tablet (50 mg total) by mouth 3 (three) times daily as needed.    Dispense:  60 tablet    Refill:  2       Procedures: No procedures performed   Clinical Data: No additional findings.   Subjective: Chief Complaint  Patient presents with   Right Hip - Pain   Lower Back - Pain    HPI patient is a pleasant 80 year old female who comes in today with recurrent right buttock pain.  She has been seen by [place] a few times over the past 9 months for right-sided piriformis syndrome.  She has been given steroids twice and has been sent to formal physical therapy as well as worked on a home exercise program all without relief of her symptoms.  She continues to have pain to the right buttock which is worse towards end of the day.  She has recently laid off on some of her stretching and has started taking ibuprofen for  which she notes relief today.  She denies any pain or paresthesias down the right leg.  No groin pain.  Review of Systems as detailed in HPI.  All others reviewed and are negative.   Objective: Vital Signs: There were no vitals taken for this visit.  Physical Exam well-developed well-nourished female no acute distress.  Alert and oriented x3.  Ortho Exam examination reveals moderate tenderness along the proximal hamstring insertion onto the ischial tuberosity. Mild tenderness to the piriformis.  Negative logroll negative straight leg raise.  She is neurovascular intact distally.  Specialty Comments:  No specialty comments available.  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  Past Medical History:  Diagnosis Date   Arthritis    wrist   Hypertension     History reviewed. No pertinent family history.  Past Surgical History:  Procedure Laterality Date   cataracts Bilateral    with lens   COLONOSCOPY W/ POLYPECTOMY      x2 -'06x1 polyp   COLONOSCOPY WITH PROPOFOL  N/A 02/24/2015   Procedure: COLONOSCOPY WITH PROPOFOL;  Surgeon: Garlan Fair, MD;  Location: [facility];  Service: Endoscopy;  Laterality: N/A;   ESOPHAGOGASTRODUODENOSCOPY (EGD) WITH PROPOFOL N/A 02/24/2015   Procedure: ESOPHAGOGASTRODUODENOSCOPY (EGD) WITH PROPOFOL;  Surgeon: Garlan Fair, MD;  [facility];  Service: Endoscopy;  Laterality: N/A;   Social History   Occupational History   Not on file  Tobacco Use   Smoking status: Former    Years: 10.00    Types: Cigarettes    Quit date: 02/15/1982    Years since quitting: 39.2   Smokeless tobacco: Not on file   Tobacco comments:    off and on smother, Quit 30 yrs ago.  Substance and Sexual Activity   Alcohol use: Yes     Comment: wine daily    Drug use: No   Sexual activity: Not on file

## 2021-06-17 ENCOUNTER — Emergency Department (HOSPITAL_BASED_OUTPATIENT_CLINIC_OR_DEPARTMENT_OTHER)
Admission: EM | Admit: 2021-06-17 | Discharge: 2021-06-17 | Disposition: A | Discharge status: Home / Self Care | Payer: Medicare Other | Attending: Emergency Medicine | Admitting: Emergency Medicine

## 2021-06-17 ENCOUNTER — Encounter (HOSPITAL_BASED_OUTPATIENT_CLINIC_OR_DEPARTMENT_OTHER): Payer: Self-pay

## 2021-06-17 ENCOUNTER — Other Ambulatory Visit: Payer: Self-pay

## 2021-06-17 ENCOUNTER — Emergency Department (HOSPITAL_BASED_OUTPATIENT_CLINIC_OR_DEPARTMENT_OTHER): Payer: Medicare Other

## 2021-06-17 DIAGNOSIS — Y92002 Bathroom of unspecified non-institutional (private) residence single-family (private) house as the place of occurrence of the external cause: Secondary | ICD-10-CM | POA: Diagnosis not present

## 2021-06-17 DIAGNOSIS — W01198A Fall on same level from slipping, tripping and stumbling with subsequent striking against other object, initial encounter: Secondary | ICD-10-CM | POA: Diagnosis not present

## 2021-06-17 DIAGNOSIS — Z79899 Other long term (current) drug therapy: Secondary | ICD-10-CM | POA: Diagnosis not present

## 2021-06-17 DIAGNOSIS — I10 Essential (primary) hypertension: Secondary | ICD-10-CM | POA: Diagnosis not present

## 2021-06-17 DIAGNOSIS — S0101XA Laceration without foreign body of scalp, initial encounter: Secondary | ICD-10-CM | POA: Insufficient documentation

## 2021-06-17 DIAGNOSIS — Z87891 Personal history of nicotine dependence: Secondary | ICD-10-CM | POA: Insufficient documentation

## 2021-06-17 MED ORDER — LIDOCAINE-EPINEPHRINE (PF) 2 %-1:200000 IJ SOLN
10.0000 mL | Freq: Once | INTRAMUSCULAR | Status: DC
  Filled 2021-06-17: qty 20

## 2021-06-17 MED ORDER — LIDOCAINE-EPINEPHRINE-TETRACAINE (LET) TOPICAL GEL
3.0000 mL | Freq: Once | TOPICAL | Status: AC
Start: 2021-06-17 — End: 2021-06-17
  Administered 2021-06-17: 3 mL via TOPICAL
  Filled 2021-06-17: qty 3

## 2021-06-17 NOTE — ED Provider Notes (Signed)
[facility] DE[facility] Provider Note   CSN: [id number] Arrival date & time: 06/17/21  1148     History Chief Complaint  Patient presents with  . Fall    Gabrielle Glover is a 80 y.o. female.   Fall Pertinent negatives include no chest pain, no abdominal pain and no shortness of breath.   Patient presents due to fall.  This happened acutely earlier today, patient was rushing to the bathroom, she tripped and fell forward hitting her left shoulder against the toilet and then falling backwards and hitting the back of her head.  She did not lose consciousness, denies any headache, nausea, vomiting, vision changes.  She does have some pain in the back of her neck.  She is not on any blood thinners, not having any pain elsewhere.  Denies any preceding symptoms.  She was given pain medicine by EMS which has been an alleviating factor.  Unsure when her last tetanus was but she says she is up-to-date.  She does have a laceration to the back of her head, bleeding is controlled.  Past Medical History:  Diagnosis Date  . Arthritis    wrist  . Hypertension     There are no problems to display for this patient.   Past Surgical History:  Procedure Laterality Date  . cataracts Bilateral    with lens  . COLONOSCOPY W/ POLYPECTOMY      x2 -'06x1 polyp  . COLONOSCOPY WITH PROPOFOL N/A 02/24/2015   Procedure: COLONOSCOPY WITH PROPOFOL;  Surgeon: Garlan Fair, MD;  Location: [facility];  Service: Endoscopy;  Laterality: N/A;  . ESOPHAGOGASTRODUODENOSCOPY (EGD) WITH PROPOFOL N/A 02/24/2015   Procedure: ESOPHAGOGASTRODUODENOSCOPY (EGD) WITH PROPOFOL;  Surgeon: Garlan Fair, MD;  Location: [facility];  Service: Endoscopy;  Laterality: N/A;     OB History   No obstetric history on file.     No family history on file.  Social History   Tobacco Use  . Smoking status: Former    Years: 10.00    Types: Cigarettes    Quit date: 02/15/1982    Years since  quitting: 39.3  . Tobacco comments:    off and on smother, Quit 30 yrs ago.  Substance Use Topics  . Alcohol use: Yes    Comment: wine daily   . Drug use: No    Home Medications Prior to Admission medications   Medication Sig Start Date End Date Taking? Authorizing Provider  atorvastatin (LIPITOR) 40 MG tablet Take 20 mg by mouth at bedtime. 11/28/14   [provider]  Cholecalciferol (VITAMIN D) 2000 UNITS CAPS Take 1 capsule by mouth every morning.    [provider]  Coenzyme Q10 (CO Q 10 PO) Take 1 tablet by mouth at bedtime.    [provider]  eszopiclone (LUNESTA) 2 MG TABS tablet Take 1 tablet by mouth at bedtime as needed for sleep.  12/21/14   [provider]  Ferrous Sulfate (IRON) 325 (65 FE) MG TABS Take 1 tablet by mouth every evening.    [provider]  metoprolol succinate (TOPROL-XL) 100 MG 24 hr tablet Take 50 mg by mouth at bedtime. 01/31/15   [provider]  mometasone (NASONEX) 50 MCG/ACT nasal spray Place 1-2 sprays into the nose daily as needed (congestion.).    [provider]  predniSONE (STERAPRED UNI-PAK 21 TAB) 10 MG (21) TBPK tablet Take as directed 12/20/20   Leandrew Koyanagi, MD  predniSONE (STERAPRED UNI-PAK 21 TAB) 5  MG (21) TBPK tablet Take as directed 10/24/20   Aundra Dubin, PA-C  SYNTHROID 88 MCG tablet Take 88 mcg by mouth every morning. 02/13/15   [provider]  traMADol (ULTRAM) 50 MG tablet Take 1 tablet (50 mg total) by mouth 3 (three) times daily as needed. 04/25/21   Aundra Dubin, PA-C  valsartan-hydrochlorothiazide (DIOVAN-HCT) 320-12.5 MG per tablet Take 0.5 tablets by mouth every morning. 12/24/14   [provider]    Allergies    Patient has no known allergies.  Review of Systems   Review of Systems  Constitutional:  Negative for fever.  Respiratory:  Negative for shortness of breath.   Cardiovascular:  Negative for chest pain.  Gastrointestinal:  Negative  for abdominal pain, nausea and vomiting.  Musculoskeletal:  Positive for neck pain.  Skin:  Positive for wound.  Neurological:  Negative for syncope.   Physical Exam Updated Vital Signs BP (!) 181/69 (BP Location: Left Arm)   Pulse 72   Temp 98.3 F (36.8 C) (Oral)   Resp 16   SpO2 100%   Physical Exam Vitals and nursing note reviewed. Exam conducted with a chaperone present.  Constitutional:      General: She is not in acute distress.    Appearance: Normal appearance.  HENT:     Head: Normocephalic.     Comments: There is laceration to the right parietal scalp.  Roughly 7 cm.    Right Ear: Tympanic membrane normal.     Left Ear: Tympanic membrane normal.     Ears:     Comments: No auricular hematoma. No hemotympanum. Negative battle sign.     Nose: Nose normal.     Comments: No clear nasal drainage concerning for CSF leak. No septal hematoma. No nasal crepitus.     Mouth/Throat:     Mouth: Mucous membranes are moist.     Pharynx: Oropharynx is clear.     Comments: No malocclusion or dental fractures. No dental avulsions appreciated.  Eyes:     General: No scleral icterus.    Extraocular Movements: Extraocular movements intact.     Pupils: Pupils are equal, round, and reactive to light.     Comments: Negative for hyphemia, normal shaped pupil. No restrictions of EOM or pain with EOMs.  Cardiovascular:     Pulses: Normal pulses.  Musculoskeletal:        General: Tenderness present. Normal range of motion.     Cervical back: Normal range of motion. Tenderness present.     Comments: Full range of motion to the left shoulder.  There is some diffuse tenderness that is reproducible with movement, no point tenderness.  No crepitus.  Skin:    Capillary Refill: Capillary refill takes less than 2 seconds.     Coloration: Skin is not jaundiced.  Neurological:     General: No focal deficit present.     Mental Status: She is alert and oriented to person, place, and time. Mental  status is at baseline.     Coordination: Coordination normal.     Comments: No facial numbness. The patient is alert and oriented to person, place, and time with normal speech. Cranial nerves III-XII are grossly intact.   Psychiatric:        Mood and Affect: Mood normal.   ED Results / Procedures / Treatments   Labs (all labs ordered are listed, but only abnormal results are displayed) Labs Reviewed - No data to display  EKG None  Radiology  No results found.  Procedures .Marland KitchenLaceration Repair  Date/Time: 06/17/2021 2:57 PM Performed by: Sherrill Raring, PA-C Authorized by: Sherrill Raring, PA-C   Consent:    Consent obtained:  Verbal   Consent given by:  Patient   Risks discussed:  Infection, need for additional repair, pain, poor cosmetic result and poor wound healing   Alternatives discussed:  No treatment and delayed treatment Universal protocol:    Procedure explained and questions answered to patient or proxy's satisfaction: yes     Relevant documents present and verified: yes     Test results available: yes     Imaging studies available: yes     Required blood products, implants, devices, and special equipment available: yes     Site/side marked: yes     Immediately prior to procedure, a time out was called: yes     Patient identity confirmed:  Verbally with patient Anesthesia:    Anesthesia method:  Local infiltration   Local anesthetic:  Lidocaine 1% WITH epi Laceration details:    Location:  Scalp   Scalp location:  R parietal   Length (cm):  7 Pre-procedure details:    Preparation:  Patient was prepped and draped in usual sterile fashion and imaging obtained to evaluate for foreign bodies Exploration:    Limited defect created (wound extended): no   Treatment:    Area cleansed with:  Povidone-iodine   Amount of cleaning:  Standard   Irrigation solution:  Sterile saline   Irrigation volume:  1L   Irrigation method:  Pressure wash   Visualized foreign  bodies/material removed: no   Skin repair:    Repair method:  Staples   Number of staples:  5 Approximation:    Approximation:  Close Repair type:    Repair type:  Simple Post-procedure details:    Dressing:  Open (no dressing)   Medications Ordered in ED Medications - No data to display  ED Course  I have reviewed the triage vital signs and the nursing notes.  Pertinent labs & imaging results that were available during my care of the patient were reviewed by me and considered in my medical decision making (see chart for details).    MDM Rules/Calculators/A&P                           Vitals are stable, patient is neurologically intact.  PE is as documented above.  She does have a hematoma to the right parietal part of her scalp.  Imaging was obtained, no intracranial bleed, no cervical spine injury.  She is been mentating well throughout her visit here today.  Laceration was repaired with 5 staples, patient handled the procedure well without any complications.  Patient discharged in stable position.  Discussed HPI, physical exam and plan of care for this patient with attending Lacretia Leigh. The attending physician evaluated this patient as part of a shared visit and agrees with plan of care.   Final Clinical Impression(s) / ED Diagnoses Final diagnoses:  None    Rx / DC Orders ED Discharge Orders     None        Sherrill Raring, PA-C 06/17/21 1500    Lacretia Leigh, MD 06/20/21 1550

## 2021-06-17 NOTE — ED Triage Notes (Signed)
Pt BIB [facility] from home, pt presents with a mechanical fall approx 1 hour ago. Pt was going out for the day, went to use the bathroom, turned quickly, lost her footing and fell on the floor. Pt was able to get up with the assistance of her friend. No LOC or  taking blood thinners. Approx 3 in lac to posterior head, bleeding controlled at this time.   BP 160/72 HR 76 RR 18 99% RA

## 2021-06-17 NOTE — Discharge Instructions (Signed)
Follow-up with your primary care doctor in a week to have the staples removed. Wash with soap and water at home. Return to the ED if things change or worsen.

## 2021-06-17 NOTE — ED Notes (Addendum)
Pt also c/o posterior neck pain. Bleeding controlled, bandage applied prior to arrival. C-collar applied in triage

## 2021-06-17 NOTE — ED Provider Notes (Signed)
I provided a substantive portion of the care of this patient.  I personally performed the entirety of the medical decision making for this encounter.      80 year old female here for mechanical fall just prior to arrival.  Patient struck the back portion of her head.  Head neck CT is negative.  Laceration to be repaired   Lacretia Leigh, MD 06/17/21 1457

## 2021-08-11 ENCOUNTER — Other Ambulatory Visit: Payer: Self-pay | Admitting: Internal Medicine

## 2021-08-11 DIAGNOSIS — R748 Abnormal levels of other serum enzymes: Secondary | ICD-10-CM

## 2021-08-21 ENCOUNTER — Ambulatory Visit
Admission: RE | Admit: 2021-08-21 | Discharge: 2021-08-21 | Disposition: A | Discharge status: Home / Self Care | Payer: Medicare Other | Source: Ambulatory Visit | Attending: Internal Medicine | Admitting: Internal Medicine

## 2021-08-21 DIAGNOSIS — R748 Abnormal levels of other serum enzymes: Secondary | ICD-10-CM

## 2022-11-08 IMAGING — CT CT HEAD W/O CM
4 series · 16 of 47 positions shown, 18 images · non-contrast
Comparison: None.

CLINICAL DATA: Head trauma.  Fall 1 hour ago.

EXAM:
CT HEAD WITHOUT CONTRAST
CT CERVICAL SPINE WITHOUT CONTRAST
TECHNIQUE: Multidetector CT imaging of the head and cervical spine was
performed following the standard protocol without intravenous
contrast. Multiplanar CT image reconstructions of the cervical spine
were also generated.

[Series 2: head wo · axial · 0.42mm/px · z∈[-141,-36]mm · 7 of 29 slices shown, 9 images]
[im 4/29  brain]
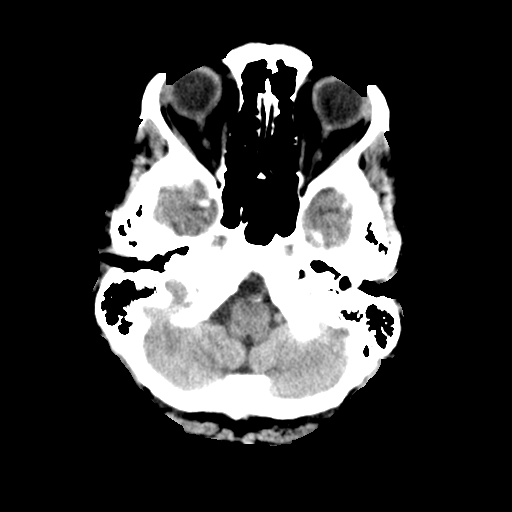
[im 4/29  bone]
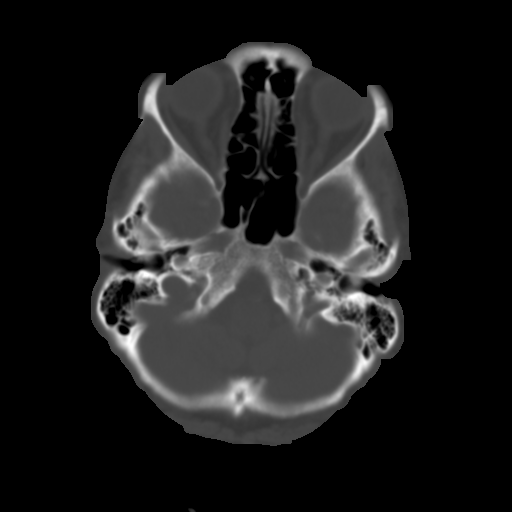
[im 8/29  brain]
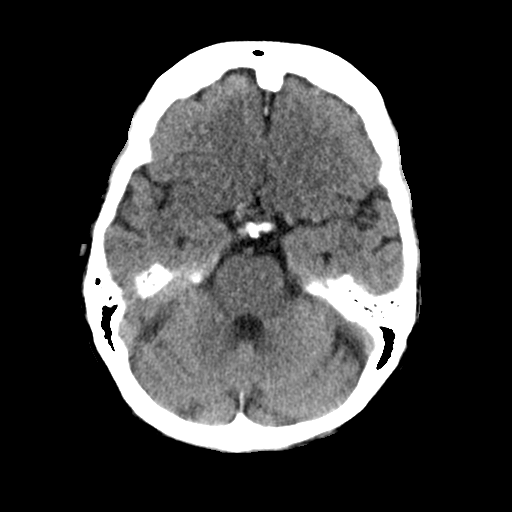
[im 11/29  brain]
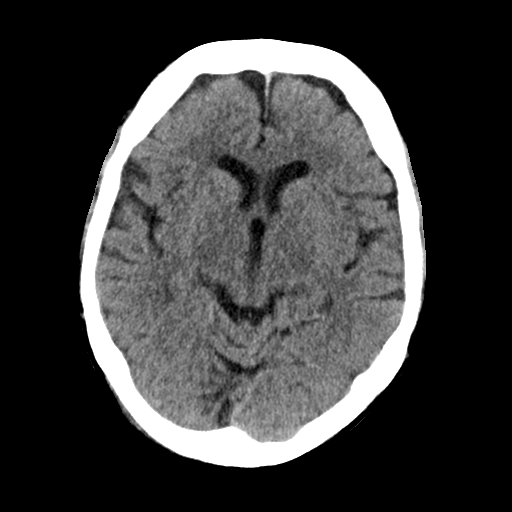
[im 15/29  brain]
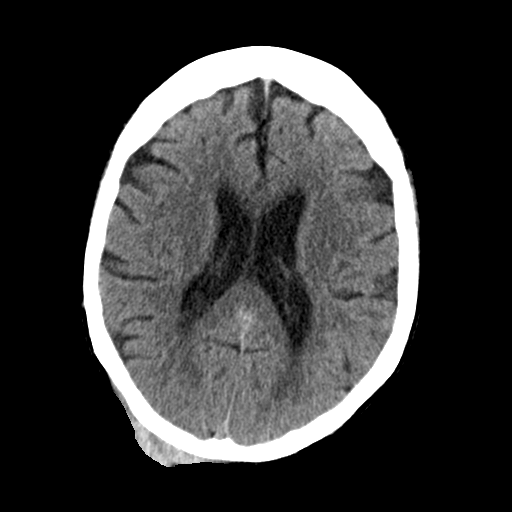
[im 18/29  brain]
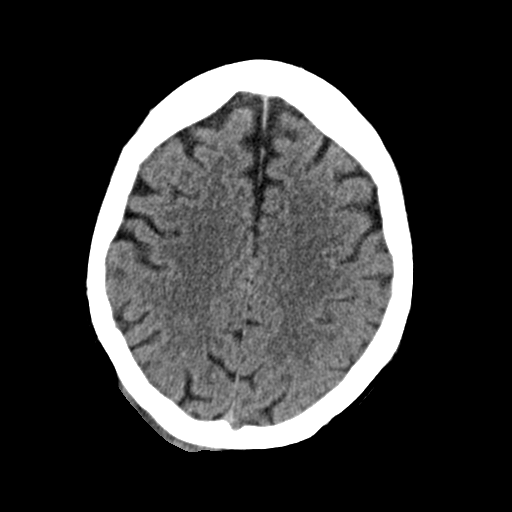
[im 18/29  bone]
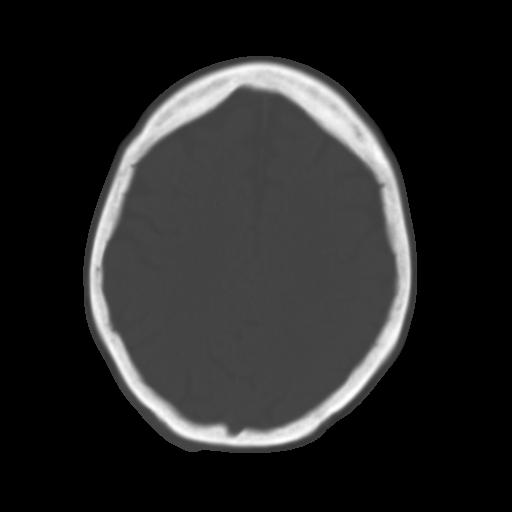
[im 22/29  brain]
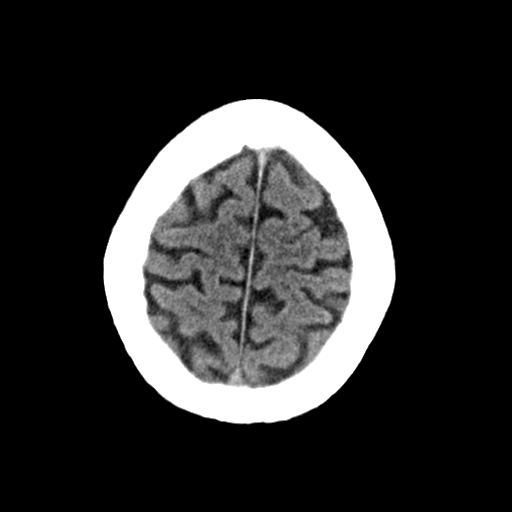
[im 25/29  brain]
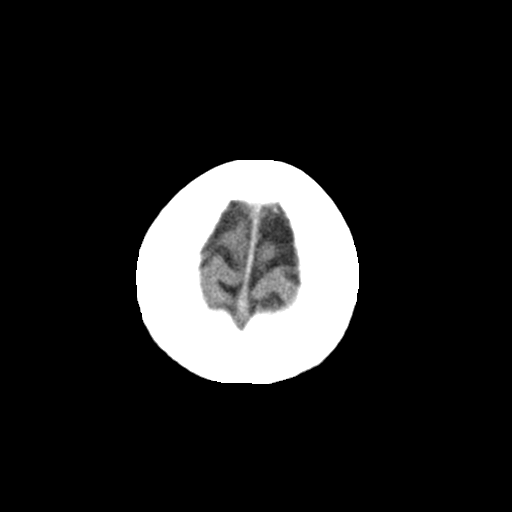

[Series 3: head bone · axial · 0.42mm/px · z∈[-142,-114]mm · 3 of 72 slices shown]
[im 8/72  bone]
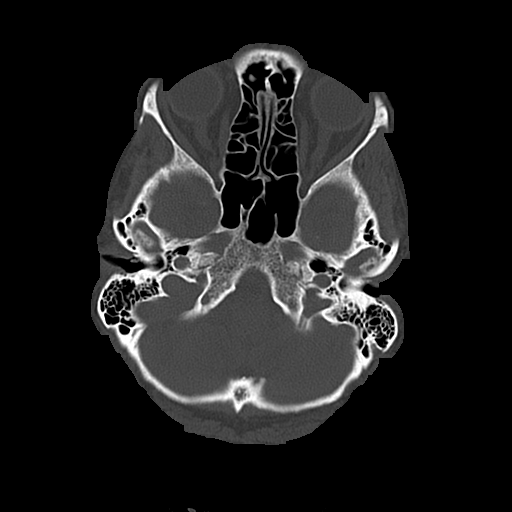
[im 15/72  bone]
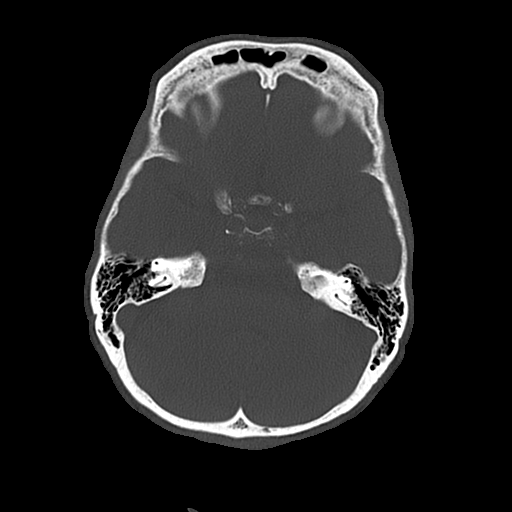
[im 22/72  bone]
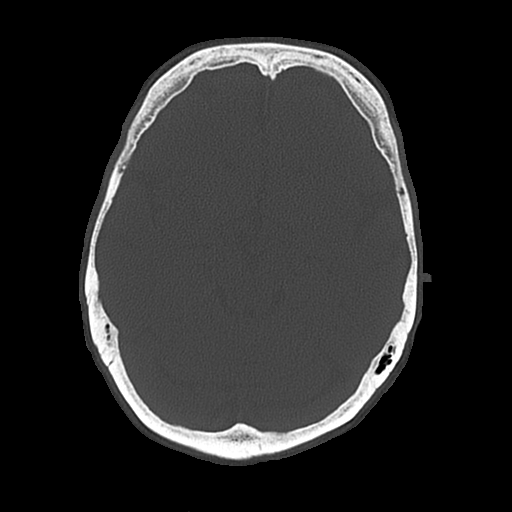

[Series 4: coronal soft · coronal · 0.33mm/px · 3 of 68 slices shown]
[im 23/68  brain]
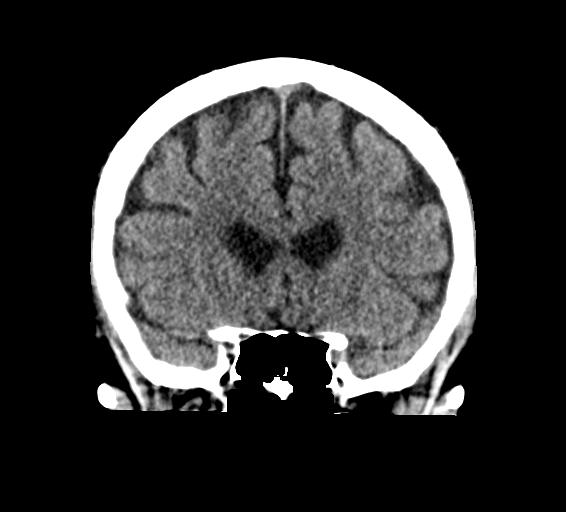
[im 30/68  brain]
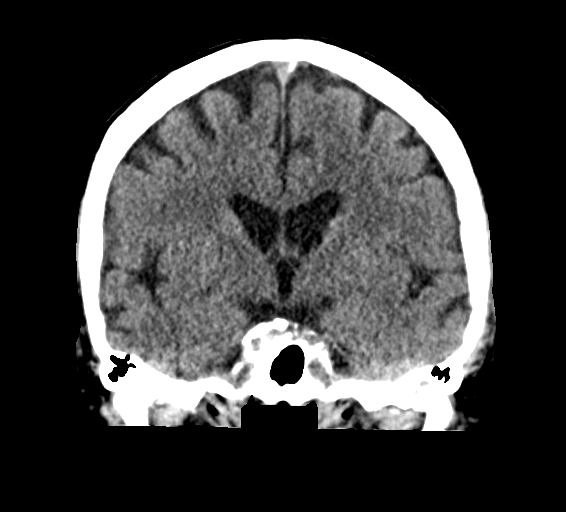
[im 38/68  brain]
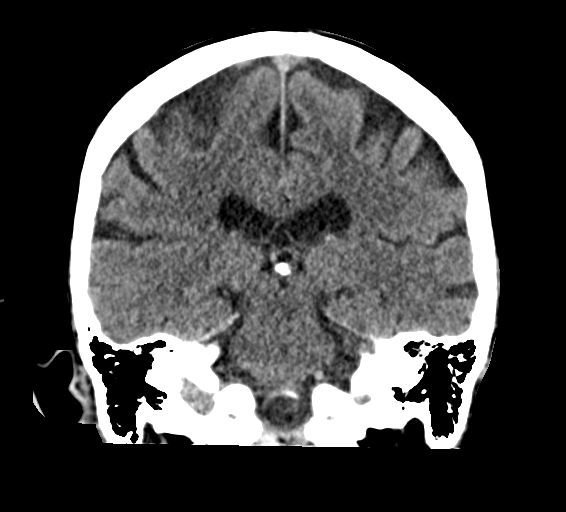

[Series 5: sagittal soft · sagittal · 0.33mm/px · 3 of 54 slices shown]
[im 18/54  brain]
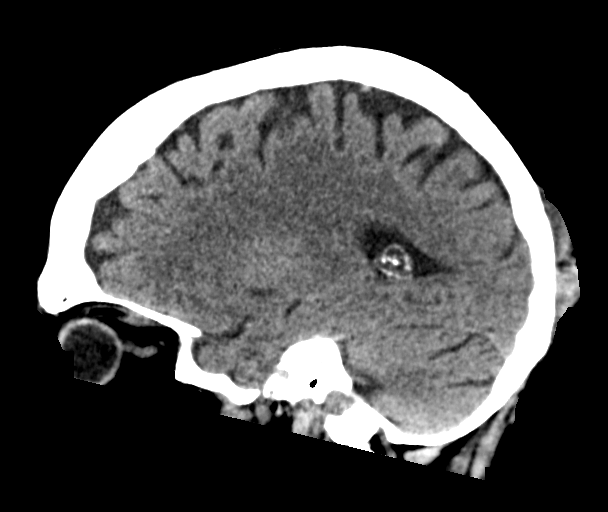
[im 27/54  brain]
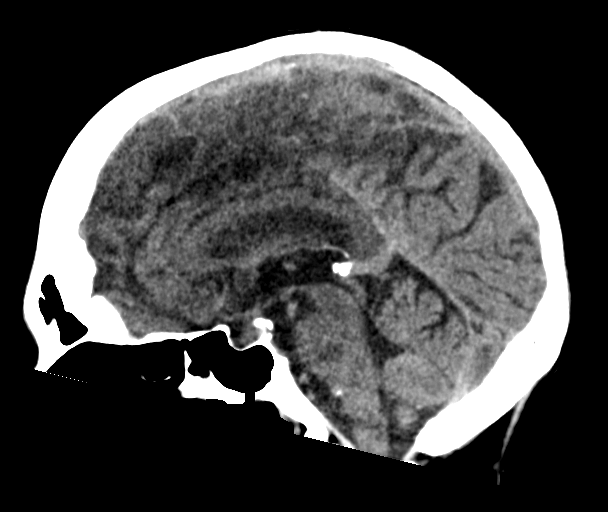
[im 36/54  brain]
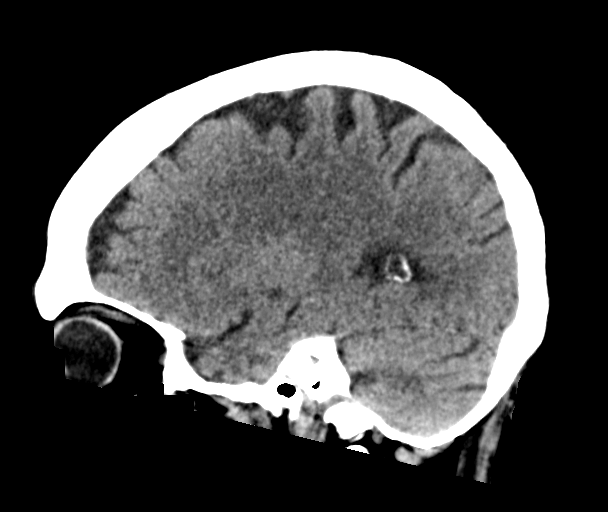

[16 of 47 positions shown; findings below may reference images not displayed]

FINDINGS: CT HEAD FINDINGS

Brain: No evidence of acute infarction, hemorrhage, hydrocephalus,
extra-axial collection or mass lesion/mass effect.

Vascular: Negative for hyperdense vessel

Skull: Negative for skull fracture.

Sinuses/Orbits: Negative

Other: Moderately large right occipital scalp hematoma.

CT CERVICAL SPINE FINDINGS

Alignment: Mild anterolisthesis C4-5.

Skull base and vertebrae: Negative for fracture

Soft tissues and spinal canal: Negative for soft tissue mass or
swelling

Disc levels: Disc degeneration and facet degeneration throughout the
cervical spine.

Upper chest: Lung apices clear bilaterally.

Other: None
IMPRESSION: 1. Normal CT of the brain.  Right occipital scalp hematoma
2. Negative for cervical spine fracture.

## 2022-11-08 IMAGING — CT CT CERVICAL SPINE W/O CM
2 of 3 series · 9 of 27 positions shown, 11 images · non-contrast
Comparison: None.

CLINICAL DATA: Head trauma.  Fall 1 hour ago.

EXAM:
CT HEAD WITHOUT CONTRAST
CT CERVICAL SPINE WITHOUT CONTRAST
TECHNIQUE: Multidetector CT imaging of the head and cervical spine was
performed following the standard protocol without intravenous
contrast. Multiplanar CT image reconstructions of the cervical spine
were also generated.

[Series 7: sag bone · sagittal · 0.22mm/px · 5 of 57 slices shown, 6 images]
[im 19/57  bone]
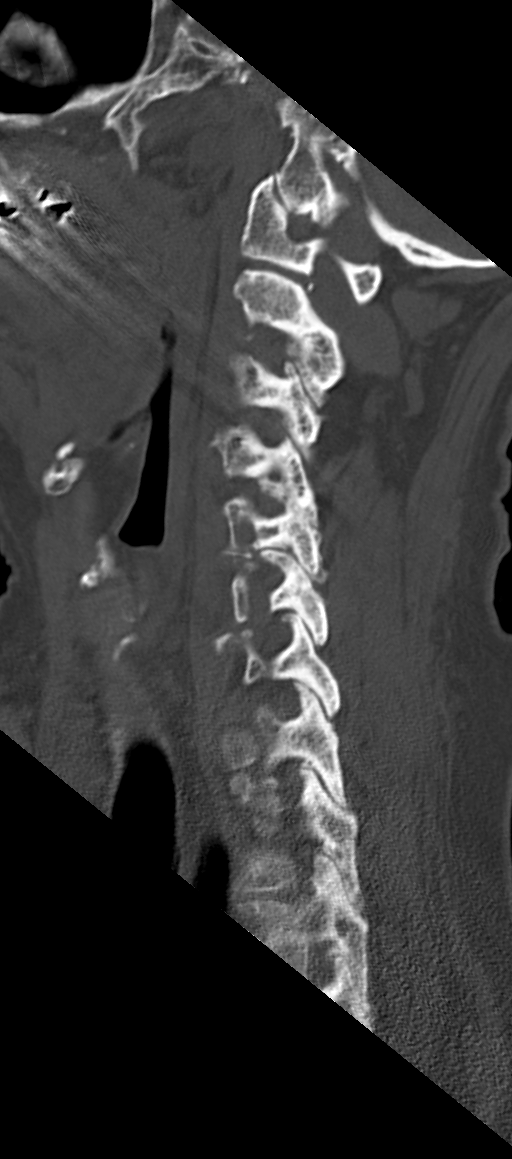
[im 24/57  bone]
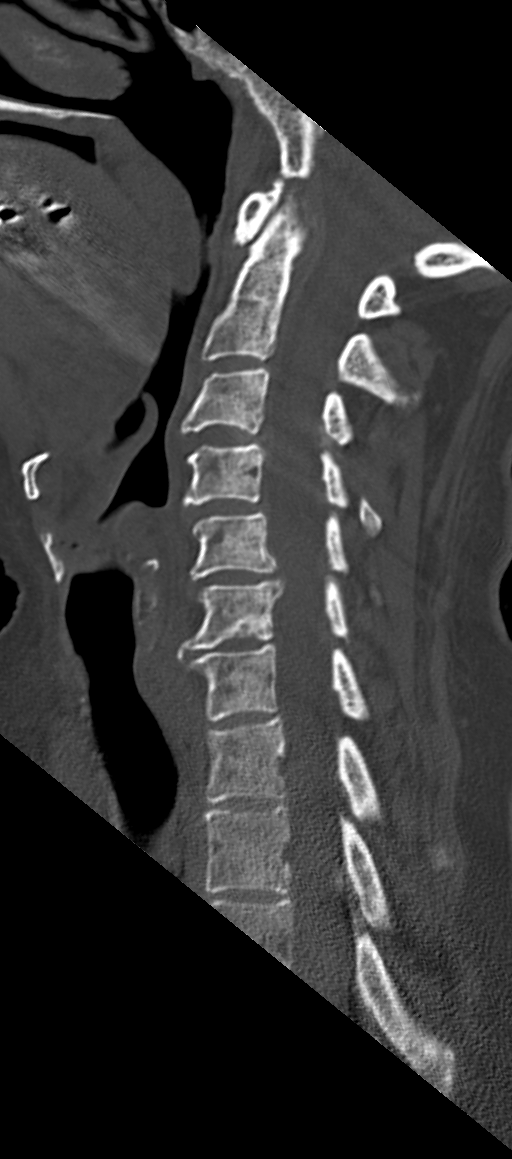
[im 29/57  soft-tissue]
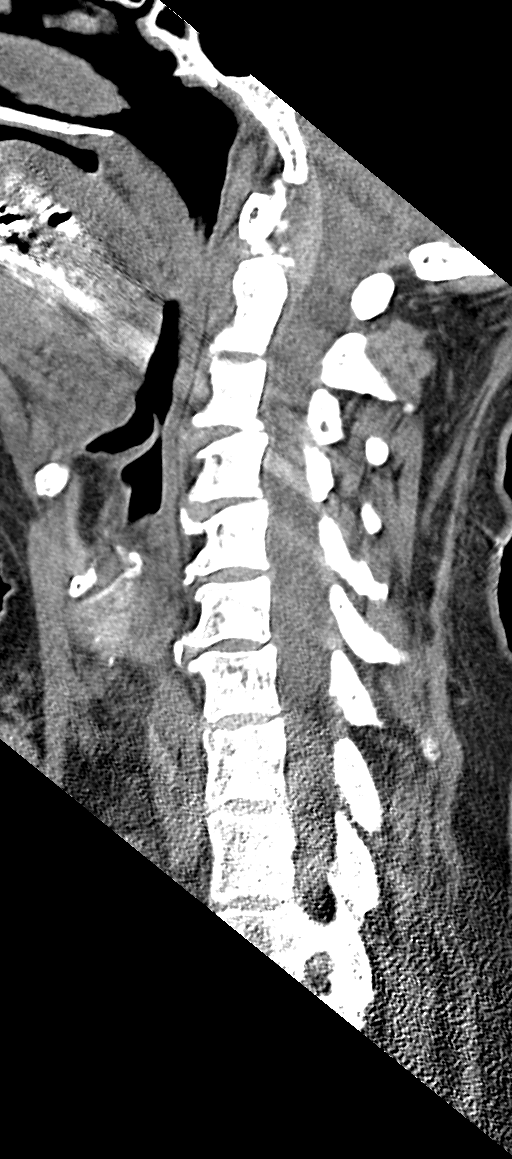
[im 29/57  bone]
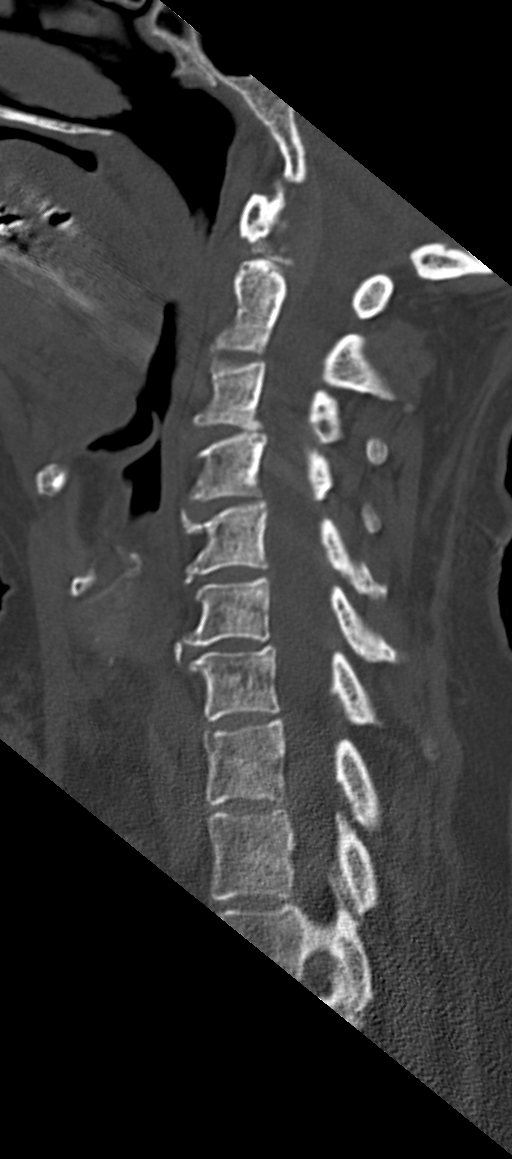
[im 33/57  bone]
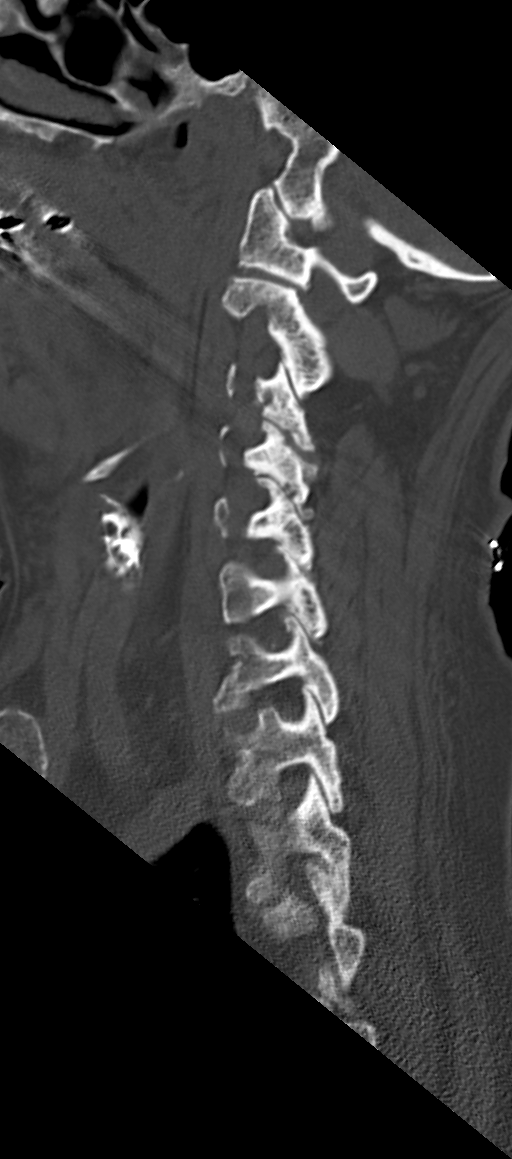
[im 38/57  bone]
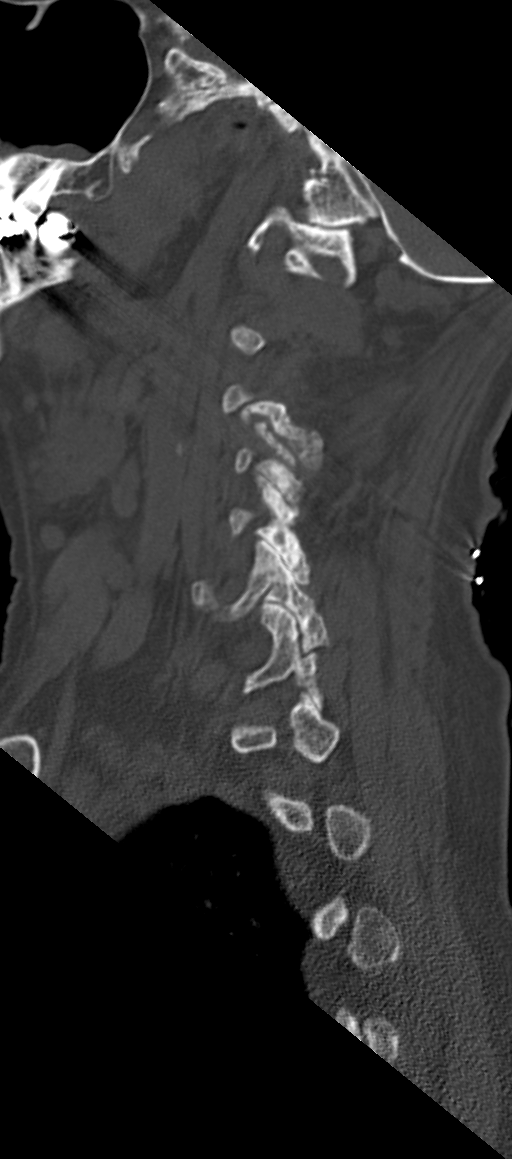

[Series 8: orthogonal axials · axial · 0.21mm/px · z∈[-288,-183]mm · 4 of 93 slices shown, 5 images]
[im 14/93  soft-tissue]
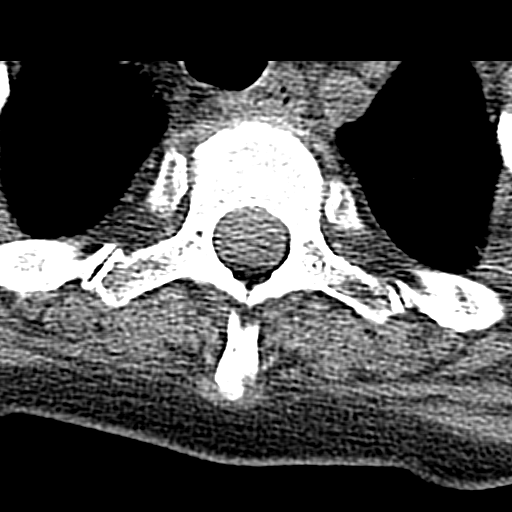
[im 14/93  bone]
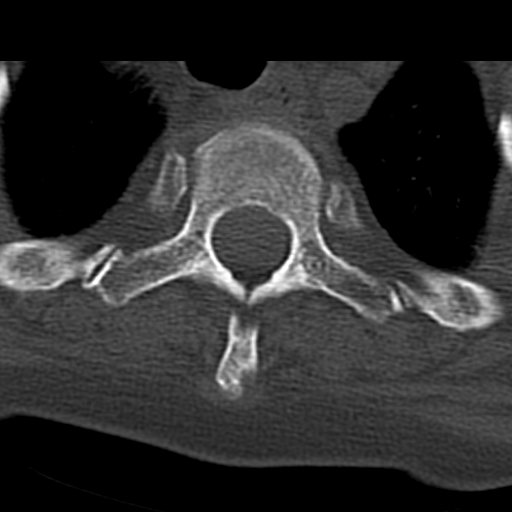
[im 40/93  bone]
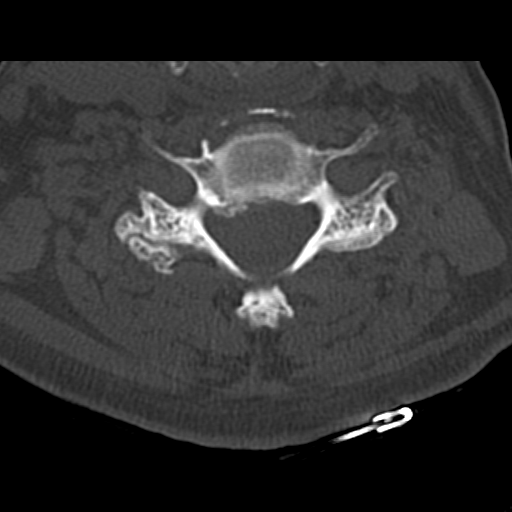
[im 53/93  bone]
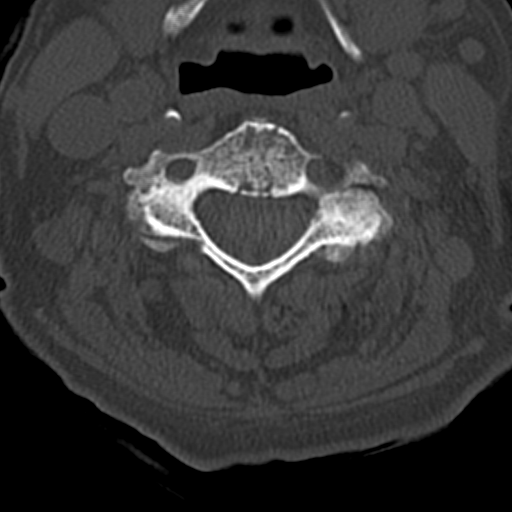
[im 79/93  bone]
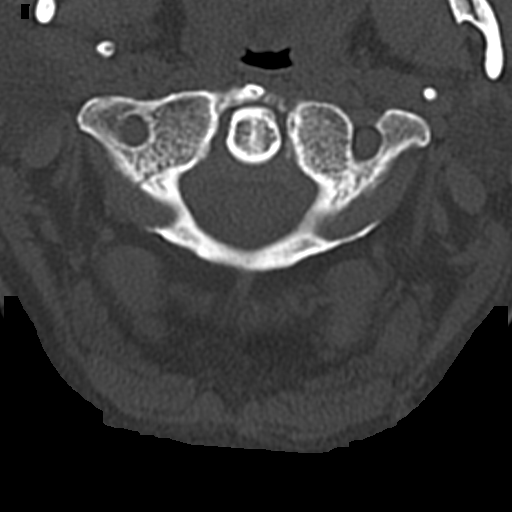

[9 of 27 positions shown; findings below may reference images not displayed]

FINDINGS: CT HEAD FINDINGS

Brain: No evidence of acute infarction, hemorrhage, hydrocephalus,
extra-axial collection or mass lesion/mass effect.

Vascular: Negative for hyperdense vessel

Skull: Negative for skull fracture.

Sinuses/Orbits: Negative

Other: Moderately large right occipital scalp hematoma.

CT CERVICAL SPINE FINDINGS

Alignment: Mild anterolisthesis C4-5.

Skull base and vertebrae: Negative for fracture

Soft tissues and spinal canal: Negative for soft tissue mass or
swelling

Disc levels: Disc degeneration and facet degeneration throughout the
cervical spine.

Upper chest: Lung apices clear bilaterally.

Other: None
IMPRESSION: 1. Normal CT of the brain.  Right occipital scalp hematoma
2. Negative for cervical spine fracture.

## 2023-12-10 ENCOUNTER — Other Ambulatory Visit (INDEPENDENT_AMBULATORY_CARE_PROVIDER_SITE_OTHER)

## 2023-12-10 ENCOUNTER — Encounter: Payer: Self-pay | Admitting: Orthopaedic Surgery

## 2023-12-10 ENCOUNTER — Ambulatory Visit: Admitting: Orthopaedic Surgery

## 2023-12-10 DIAGNOSIS — M25562 Pain in left knee: Secondary | ICD-10-CM

## 2023-12-10 DIAGNOSIS — G8929 Other chronic pain: Secondary | ICD-10-CM | POA: Diagnosis not present

## 2023-12-10 MED ORDER — BUPIVACAINE HCL 0.5 % IJ SOLN
2.0000 mL | INTRAMUSCULAR | Status: AC | PRN
  Administered 2023-12-10: 2 mL via INTRA_ARTICULAR

## 2023-12-10 MED ORDER — LIDOCAINE HCL 1 % IJ SOLN
2.0000 mL | INTRAMUSCULAR | Status: AC | PRN
  Administered 2023-12-10: 2 mL

## 2023-12-10 MED ORDER — METHYLPREDNISOLONE ACETATE 40 MG/ML IJ SUSP
40.0000 mg | INTRAMUSCULAR | Status: AC | PRN
  Administered 2023-12-10: 40 mg via INTRA_ARTICULAR

## 2023-12-10 NOTE — Progress Notes (Signed)
 Office Visit Note   Patient: Gabrielle Glover           Date of Birth: Dec 25, 1940           MRN: [id number] Visit Date: 12/10/2023              Requested by: Aldo Hun, MD [address] [place],  [place] [address] PCP: Aldo Hun, MD   Assessment & Plan: Visit Diagnoses:  1. Chronic pain of left knee     Plan: Assessment and Plan    Knee osteoarthritis Chronic knee osteoarthritis with medial pain, likely arthritis flare. X-rays show joint space narrowing. - Administered cortisone injection to the knee. - Recommended knee compression sleeve during ambulation.      Follow-Up Instructions: No follow-ups on file.   Orders:  Orders Placed This Encounter  Procedures   Large Joint Inj   XR KNEE 3 VIEW LEFT   No orders of the defined types were placed in this encounter.     Procedures: Large Joint Inj: L knee on 12/10/2023 12:45 PM Details: 22 G needle Medications: 2 mL bupivacaine  0.5 %; 2 mL lidocaine  1 %; 40 mg methylPREDNISolone  acetate 40 MG/ML Outcome: tolerated well, no immediate complications Patient was prepped and draped in the usual sterile fashion.       Clinical Data: No additional findings.   Subjective: Chief Complaint  Patient presents with   Left Knee - Pain    HPI Discussed the use of AI scribe software for clinical note transcription with the patient, who gave verbal consent to proceed.  History of Present Illness   Gabrielle Glover is an 83 year old female with arthritis who presents with knee pain.  She experiences knee pain for two months, described as soreness on the medial aspect with occasional radiation to the posterior knee. The pain is intermittent and worsens with stiffness as the day progresses, affecting her ability to walk by day's end. She avoids walking due to fear of exacerbating the pain and difficulty returning home. She received a cortisone injection nine years ago for similar symptoms, which was effective. There  is no swelling in the knee. Flexibility and motion are mostly good, though stiffness increases by the end of the day.     Review of Systems  Constitutional: Negative.   HENT: Negative.    Eyes: Negative.   Respiratory: Negative.    Cardiovascular: Negative.   Endocrine: Negative.   Musculoskeletal: Negative.   Neurological: Negative.   Hematological: Negative.   Psychiatric/Behavioral: Negative.    All other systems reviewed and are negative.    Objective: Vital Signs: There were no vitals taken for this visit.  Physical Exam Vitals and nursing note reviewed.  Constitutional:      Appearance: She is well-developed.  HENT:     Head: Atraumatic.     Nose: Nose normal.  Eyes:     Extraocular Movements: Extraocular movements intact.  Cardiovascular:     Pulses: Normal pulses.  Pulmonary:     Effort: Pulmonary effort is normal.  Abdominal:     Palpations: Abdomen is soft.  Musculoskeletal:     Cervical back: Neck supple.  Skin:    General: Skin is warm.     Capillary Refill: Capillary refill takes less than 2 seconds.  Neurological:     Mental Status: She is alert. Mental status is at baseline.  Psychiatric:        Behavior: Behavior normal.  Thought Content: Thought content normal.        Judgment: Judgment normal.     Ortho Exam Physical Exam   MUSCULOSKELETAL: No swelling in the knee. Knee flexibility normal, ligaments stable.     Specialty Comments:  No specialty comments available.  Imaging: XR KNEE 3 VIEW LEFT Result Date: 12/10/2023 X-rays of the left knee show minimal osteoarthritis.  No acute abnormalities.    PMFS History: There are no active problems to display for this patient.  Past Medical History:  Diagnosis Date   Arthritis    wrist   Hypertension     History reviewed. No pertinent family history.  Past Surgical History:  Procedure Laterality Date   cataracts Bilateral    with lens   COLONOSCOPY W/ POLYPECTOMY      x2  -'06x1 polyp   COLONOSCOPY WITH PROPOFOL  N/A 02/24/2015   Procedure: COLONOSCOPY WITH PROPOFOL ;  Surgeon: Garrett Kallman, MD;  Location: [facility];  Service: Endoscopy;  Laterality: N/A;   ESOPHAGOGASTRODUODENOSCOPY (EGD) WITH PROPOFOL  N/A 02/24/2015   Procedure: ESOPHAGOGASTRODUODENOSCOPY (EGD) WITH PROPOFOL ;  Surgeon: Garrett Kallman, MD;  Location: [facility];  Service: Endoscopy;  Laterality: N/A;   Social History   Occupational History   Not on file  Tobacco Use   Smoking status: Former    Current packs/day: 0.00    Types: Cigarettes    Start date: 02/16/1972    Quit date: 02/15/1982    Years since quitting: 41.8   Smokeless tobacco: Not on file   Tobacco comments:    off and on smother, Quit 30 yrs ago.  Substance and Sexual Activity   Alcohol use: Yes    Comment: wine daily    Drug use: No   Sexual activity: Not on file

## 2024-06-15 ENCOUNTER — Encounter: Payer: Self-pay | Admitting: Radiology
